# Patient Record
Sex: Female | Born: 1940 | Race: White | Hispanic: No | Marital: Married | Smoking: Never smoker
Health system: Southern US, Community
[De-identification: ages and names within clinical notes are randomized; demographics above are authoritative.]

## PROBLEM LIST (undated history)

## (undated) DIAGNOSIS — J45909 Unspecified asthma, uncomplicated: Secondary | ICD-10-CM

## (undated) DIAGNOSIS — C4491 Basal cell carcinoma of skin, unspecified: Secondary | ICD-10-CM

## (undated) DIAGNOSIS — R768 Other specified abnormal immunological findings in serum: Secondary | ICD-10-CM

## (undated) HISTORY — DX: Unspecified asthma, uncomplicated: J45.909

## (undated) HISTORY — DX: Basal cell carcinoma of skin, unspecified: C44.91

## (undated) HISTORY — DX: Other specified abnormal immunological findings in serum: R76.8

---

## 1997-02-21 DIAGNOSIS — C4491 Basal cell carcinoma of skin, unspecified: Secondary | ICD-10-CM

## 1997-02-21 HISTORY — DX: Basal cell carcinoma of skin, unspecified: C44.91

## 2000-02-22 HISTORY — PX: CERVICAL LAMINECTOMY: SHX94

## 2006-02-21 HISTORY — PX: LAMINECTOMY AND MICRODISCECTOMY LUMBAR SPINE: SHX1913

## 2013-12-18 ENCOUNTER — Ambulatory Visit (INDEPENDENT_AMBULATORY_CARE_PROVIDER_SITE_OTHER): Payer: 59 | Admitting: Sports Medicine

## 2013-12-18 VITALS — BP 121/56

## 2013-12-18 DIAGNOSIS — M75121 Complete rotator cuff tear or rupture of right shoulder, not specified as traumatic: Secondary | ICD-10-CM

## 2013-12-18 DIAGNOSIS — M75101 Unspecified rotator cuff tear or rupture of right shoulder, not specified as traumatic: Secondary | ICD-10-CM | POA: Insufficient documentation

## 2013-12-18 DIAGNOSIS — M12811 Other specific arthropathies, not elsewhere classified, right shoulder: Secondary | ICD-10-CM | POA: Insufficient documentation

## 2013-12-18 DIAGNOSIS — M25511 Pain in right shoulder: Secondary | ICD-10-CM

## 2013-12-18 MED ORDER — NITROGLYCERIN 0.2 MG/HR TD PT24: 0.2000 mg | MEDICATED_PATCH | Freq: Every day | TRANSDERMAL | Status: DC

## 2013-12-18 MED ORDER — NITROGLYCERIN 0.2 MG/HR TD PT24: MEDICATED_PATCH | TRANSDERMAL | Status: DC

## 2013-12-18 NOTE — Patient Instructions (Signed)
Your Tai chi has been helpful You do have a biceps tendon that we can see well on [place] that did not show on MRI 3 rotator cuff tendons are normal today  The tendon with the largest tear is healing The retraction is only 0.8 cms now There is only 30% of the tendon fibers involved  Suggest using NTG to stimulate biologic healing  .Nitroglycerin Protocol   Apply 1/4 nitroglycerin patch to affected area daily.  Change position of patch within the affected area every 24 hours.  You may experience a headache during the first 1-2 weeks of using the patch, these should subside.  If you experience headaches after beginning nitroglycerin patch treatment, you may take your preferred over the counter pain reliever.  Another side effect of the nitroglycerin patch is skin irritation or rash related to patch adhesive.  Please notify our office if you develop more severe headaches or rash, and stop the patch.  Tendon healing with nitroglycerin patch may require 12   Do not use if you have migraines or rosacea.   In addition to [place] I want you to do some home exercises on an easy basis  Recheck this with repeat scan in 6 weeks

## 2013-12-18 NOTE — Progress Notes (Signed)
Patient ID: Glenda Reese, female   DOB: September 22, 1940, 73 y.o.   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 She developed a RC tear primarily involving SST but with some tendinopathy in others as documented by July MRI at [facility] Dr Eden Lathe and Marlinda Mike-- who both recommended surgery because of 1 to 2 cm retraction At that time having severe pain  With Tai chi pain has resolved Motion is full She feels no real limitation in ADL  Comes for opinion about whether she can handle this conservatively  Healthy in other regards except hx of two lumbar surgeries which the [place] helped her recover from and become functional  Only medical issue of note is asthma and she uses Dulera for this  Exam  Pleasant, thin older F BP 121/56  Shoulder: Inspection reveals no abnormalities, atrophy or asymmetry. Palpation is normal with no tenderness over AC joint or bicipital groove. ROM is full in all planes. Rotator cuff strength normal throughout. No signs of impingement with negative Neer and Hawkin's tests, empty can. Speeds and Yergason's tests normal. No labral pathology noted with negative Obrien's, negative clunk and good stability. Normal scapular function observed. No painful arc and no drop arm sign. No apprehension sign  I don't see popeye deformity on RT even though MRI showed absent BT  Only pain today was with harder resistance of Hawkins and Yergason's and this was mild pain   MSK [place] right shoulder Biceps tendon is visualized and is intact note this was not seen on the MRI There is some mild irregularity and arthritis of the humeral head as well as some mild a.c. joint arthritis with a slight effusion Supraspinatous tendon shows that two thirds of the tendon is intact but one third of the tendon shows a partially retracted tear that is full thickness in the midportion of the tendon The retraction is less than seen on the MRI and  now measures 0.84 cm In the interval view this involves about 30% of the tendon  Subscapularis, infraspinatus and teres minor tendons are all normal

## 2013-12-18 NOTE — Assessment & Plan Note (Signed)
Pain is at an acceptable level so we did not start any medications today  She will continue motion and some home exercises

## 2013-12-18 NOTE — Assessment & Plan Note (Signed)
Patient has had significant improvement since her original injury which occurred late spring She has full function of the right shoulder and no night pain  Ultrasound does show retraction noted a small and the supraspinatous tendon fibers torn amount to about 30% of the total tendon width  Trial with home exercise program with very light weight Continue tai chi  Nitroglycerin protocol  Recheck in 6 weeks as I do not think she is a surgical candidate considering the improvement she has made

## 2013-12-19 ENCOUNTER — Encounter: Payer: Self-pay | Admitting: Sports Medicine

## 2014-01-29 ENCOUNTER — Ambulatory Visit: Payer: 59 | Admitting: Sports Medicine

## 2014-03-06 ENCOUNTER — Ambulatory Visit (INDEPENDENT_AMBULATORY_CARE_PROVIDER_SITE_OTHER): Payer: 59 | Admitting: Sports Medicine

## 2014-03-06 ENCOUNTER — Encounter: Payer: Self-pay | Admitting: Sports Medicine

## 2014-03-06 ENCOUNTER — Encounter (INDEPENDENT_AMBULATORY_CARE_PROVIDER_SITE_OTHER): Payer: Self-pay

## 2014-03-06 VITALS — BP 136/75 | HR 72 | Ht 64.0 in | Wt 117.0 lb

## 2014-03-06 DIAGNOSIS — M75121 Complete rotator cuff tear or rupture of right shoulder, not specified as traumatic: Secondary | ICD-10-CM

## 2014-03-06 DIAGNOSIS — M25511 Pain in right shoulder: Secondary | ICD-10-CM

## 2014-03-06 NOTE — Progress Notes (Signed)
  Glenda Reese - 74 y.o. female MRN [id number]  Date of birth: 30-May-1940  Glenda Reese is here to follow up: CC: Right Sided Shoulder Pain Patient shoulder pain has significantly improved since her last appointment. It is almost completely disappeared following her appointment in October after using nitroglycerin therapeutic exercises. She did have some return of her pain last month after cutting back on her exercises and increasing her activity around the holidays. She does have 10 grandchildren and was busy but denies having to lift her grandchildren or any significant injury that occurred over this time. She has since resumed her activity and in therapeutic exercises and this is been improving. Development Glenda Reese of her symptoms are mainly pain over the lateral aspect of her shoulder. It seems to be on the lateral and superior aspect which is improved compared to the anterior aspect. Therapeutic exercises do help decrease her pain when flared up. She is also being seen by her Integrative Medicine specialist Dr. Judeen Reese and is interested in neural therapy in addition to the home exercises she is doing.   ROS:  Per HPI.   HISTORY: Past Medical, Surgical, Social, and Family History Reviewed & Updated per EMR.  Pertinent Historical Findings include:  reports that she has never smoked. She does not have any smokeless tobacco history on file. Active and tai chi.   OBJECTIVE:  VS:   HT:_0  (162.6 cm)   WT:117 lb (53.071 kg)  BMI:20.1          BP:136/75 mmHg  HR:72bpm  TEMP: ( )  RESP:   PHYSICAL EXAM: GENERAL:  adult Caucasian within female. In no discomfort; no respiratory distress   PSYCH: alert and appropriate, good insight   NEURO: sensation is intact to light touch inbilateral upper extremities   VASCULAR:  radial  pulses 2/4.  No significant edema.    Right Shoulder Exam: Appearance: Normal alignment, Normal Contours  Skin: No overlying erythema/ecchymosis.  Palpation: Minimal pain  and moderate crepitation with axial loading and circumduction; minimal crepitation on left TTP over: anterior and superior shouler  Strength, ROM & OtherTests: Internal Rotation: Normal External Rotation: Normal Empty can: slight pain, 5+/5 strength Hawkins: Normal Neers: painful Speeds:Normal O'Brien's: painful on right      Limited MSK Ultrasound of Right Shoulder: Findings: Biceps Tendon: Normal, small caliber Pec Major Insertion: Normal Subscapularis Tendon: Normal Supraspinatus Tendon:Abnormal- hyopechoic change with retraction of 0.8cm involving ~1.2cm of tendon, decreased fluid Infraspinatus/Teres Minor Tendon:Normal AC Joint:Abnormal- moderate degerative changes  Impression: The above findings are consistent with full thickness SST tear with improvement in pain and retraction significant less than prior MRI     ASSESSMENT: 1. Shoulder pain, right   2. Full thickness rotator cuff tear, right    PLAN: See problem based charting & AVS for additional documentation.  Continue HEP as below  Resume Nitroglycerin - will likely need to have closer to 6 months of treatment given extent of initial injury  Discussed Neural therapy and if pt wants to proceed we are amenable to her trying it  Rx Today: Continue Nitro  HEP: [place] to shoulder level and 90.  Light Theraband  > No Follow-up on file.

## 2014-03-13 ENCOUNTER — Other Ambulatory Visit: Payer: Self-pay | Admitting: *Deleted

## 2014-03-13 MED ORDER — NITROGLYCERIN 0.2 MG/HR TD PT24: MEDICATED_PATCH | TRANSDERMAL | Status: DC

## 2014-06-12 ENCOUNTER — Encounter: Payer: Self-pay | Admitting: Sports Medicine

## 2014-06-12 ENCOUNTER — Ambulatory Visit (INDEPENDENT_AMBULATORY_CARE_PROVIDER_SITE_OTHER): Payer: Medicare Other | Admitting: Sports Medicine

## 2014-06-12 VITALS — BP 127/61 | Ht 64.5 in | Wt 115.0 lb

## 2014-06-12 DIAGNOSIS — M25511 Pain in right shoulder: Secondary | ICD-10-CM

## 2014-06-12 DIAGNOSIS — M21371 Foot drop, right foot: Secondary | ICD-10-CM | POA: Diagnosis not present

## 2014-06-12 DIAGNOSIS — M75121 Complete rotator cuff tear or rupture of right shoulder, not specified as traumatic: Secondary | ICD-10-CM | POA: Diagnosis not present

## 2014-06-12 MED ORDER — GABAPENTIN 100 MG PO CAPS: ORAL_CAPSULE | ORAL | Status: DC

## 2014-06-12 NOTE — Assessment & Plan Note (Signed)
Chronic condition  - prior rigid three-quarter length orthotic modified today with reverse toe lift with three-quarter heel wedge. 1. Toe lift added 2. Discussed bracing options if she is interested in the future but we will defer this time. 3. Gabapentin titration discussed

## 2014-06-12 NOTE — Progress Notes (Signed)
Glenda Reese - 74 y.o. female MRN [id number]  Date of birth: 1940-06-24  SUBJECTIVE: CC: 1. Shoulder pain, follow up 2. Right foot drop, initial evaluation      HPI:   reports overall her right shoulder is doing significantly better. She's been performing her therapeutic exercises and using nitroglycerin patch. Excellent symptoms to some weakness with overhead reach and with terminal external rotation  She will have occasional pain that radiates down either her left or right arm as described as a "zing". Prior cervical surgery for HNP. Denies any significant weakness with this.  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  Occasionally taking gabapentin      ROS:   denies any fevers, chills, recent weight gain or weight loss.  No nighttime awakenings due to neck or back pain.    HISTORY:  Past Medical, Surgical, Social, and Family History reviewed & updated per EMR.  Pertinent Historical Findings include: Social History   Occupational History  . Not on file.   Social History Main Topics  . Smoking status: Never Smoker   . Smokeless tobacco: Not on file  . Alcohol Use: Not on file  . Drug Use: Not on file  . 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 In no discomfort; no respiratory distress   PSYCH: alert and appropriate, good insight   NEURO: Upper extremity strength is 5+/5 in all myotomes; sensation is intact to light touch in all dermatomes. He is able to toe walk without difficulty but is unable to perform right-sided heel walking. EHL strength 3/5. Marland Kitchen  VASCULAR:  radial  pulses 2/4.  No significant edema.   GAIT: She does have a foot drop on the right with moderate dorsiflexion Right Shoulder Exam: Appearance: Normal alignment, Normal Contours  Skin: No overlying erythema/ecchymosis.  Palpation: Minimal pain and moderate crepitation with axial loading and circumduction; minimal crepitation on left TTP over: anterior and superior shouler  Strength, ROM & OtherTests: Internal Rotation: Normal External Rotation: Normal Empty can: slight pain, 5/5 strength Hawkins: Normal Neers: Normal Speeds:Normal O'Brien's: Normal    DATA OBTAINED:   Limited MSK Ultrasound of Right Shoulder: Findings: Biceps Tendon: Intact, small amount of fluid Pec Major Insertion: normal Subscapularis Tendon: Bursal fluid layer with tendon that is intact with small amount of calcification at the insertion Supraspinatus Tendon: Full thickness tear retracted approximately half a centimeter Infraspinatus/Teres Minor Tendon: Normal AC Joint: Small amount of hypoechoic mushroom sign with sclerotic changes  Impression: The above findings are consistent with full-thickness supraspinatus tear retracted approximately 0.5 cm. AC arthropathy.        ASSESSMENT & PLAN:  See problem based charting & AVS for additional documentation Problem List Items Addressed This Visit    Shoulder pain, right - Primary    Chronic condition  - full-thickness supraspinatus tear, started nitroglycerin therapy in October 2015. Ultrasound evidence of improvement 2 cm retraction on exam today. Symptoms markedly improved some suggestion of cervical radiculitis on  exam and by history. 1. Continue nitroglycerin patch, tolerating well 2. Home exercise program reviewed today 3. Recommend increasing frequency of gabapentin dosing when symptoms are worse & taking for 3-5 days at minimum daily at bedtime when having flareups. 4. Follow-up in 2 months for repeat ultrasound         Full thickness rotator cuff tear   Foot drop, right    Chronic condition  - prior rigid three-quarter length orthotic modified today with reverse toe lift with three-quarter heel wedge. 5. Toe lift added 6. Discussed bracing options if she is interested in the future but we will defer this time. 7. Gabapentin titration discussed           FOLLOW UP:  Return in about 2 months (around 08/12/2014).

## 2014-06-12 NOTE — Patient Instructions (Signed)
Nitroglycerin Protocol   Apply 1/4 nitroglycerin patch to affected area daily.  Change position of patch within the affected area every 24 hours.  You may experience a headache during the first 1-2 weeks of using the patch, these should subside.  If you experience headaches after beginning nitroglycerin patch treatment, you may take your preferred over the counter pain reliever.  Another side effect of the nitroglycerin patch is skin irritation or rash related to patch adhesive.  Please notify our office if you develop more severe headaches or rash, and stop the patch.  Tendon healing with nitroglycerin patch may require 12 to 24 weeks depending on the extent of injury.  Men should not use if taking Viagra, Cialis, or Levitra.   Do not use if you have migraines or rosacea.   Neck Exercises: Stand against the wall Towel Stretching    For the below:  Hold 3-4seconds. Release the tension in a controlled manner as you return to the starting position.  Repeat 15 times. Complete this exercise 3 times per day.   DO NOT GO OVERHEAD   STRENGTH - Internal Rotators  Secure a rubber exercise band/tubing to a fixed object so that it is at the same height as your right / left elbow when you are standing or sitting on a firm surface.  Stand or sit so that the secured exercise band/tubing is at your right / left side.  Bend your elbow 90 degrees. Place a folded towel or small pillow under your right / left arm so that your elbow is a few inches away from your side.  Keeping the tension on the exercise band/tubing, pull it across your body toward your abdomen. Be sure to keep your body steady so that the movement is only coming from your shoulder rotating.  STRENGTH - External Rotators  Secure a rubber exercise band/tubing to a fixed object so that it is at the same height as your right / left elbow when you are standing or sitting on a firm surface.  Stand or sit so that the secured  exercise band/tubing is at your side that is not injured.  Bend your elbow 90 degrees. Place a folded towel or small pillow under your right / left arm so that your elbow is a few inches away from your side.  Keeping the tension on the exercise band/tubing, pull it away from your body, as if pivoting on your elbow. Be sure to keep your body steady so that the movement is only coming from your shoulder rotating.  STRENGTH - Supraspinatus  Stand or sit with good posture. Grasp a __________ lb weight or an exercise band/tubing so that your hand is "thumbs-up," like when you shake hands.  Slowly lift your right / left hand from your thigh into the air, traveling about 30 degrees from straight out at your side. Lift your hand to shoulder height or as far as you can without increasing any shoulder pain. Initially, many people do not lift their hands above shoulder height.  Avoid shrugging your right / left shoulder as your arm rises by keeping your shoulder blade tucked down and toward your mid-back spine.  STRENGTH - Shoulder Abductors  Stand or sit with good posture. Place your right / left arm at your side.  With a thumbs-up grasp, hold a __________ weight or a secured rubber exercise band/tubing in your right / left hand. Slowly lift your arm from your side as far as you can without reproducing any of  your shoulder pain. Do not lift your hand above shoulder-height unless you have been instructed to do so by your physician, physical therapist or athletic trainer. If this motion causes pain or increased discomfort, discuss this with your physician, physical therapist, or athletic trainer.  Avoid shrugging your right / left shoulder as your arm rises by keeping your shoulder blade tucked down and toward your mid-back spine.

## 2014-06-12 NOTE — Procedures (Signed)
Limited MSK Ultrasound of Right Shoulder: Findings: Biceps Tendon: Intact, small amount of fluid Pec Major Insertion: normal Subscapularis Tendon: Bursal fluid layer with tendon that is intact with small amount of calcification at the insertion Supraspinatus Tendon: Full thickness tear retracted approximately half a centimeter Infraspinatus/Teres Minor Tendon: Normal AC Joint: Small amount of hypoechoic mushroom sign with sclerotic changes  Impression: The above findings are consistent with full-thickness supraspinatus tear retracted approximately 0.5 cm. AC arthropathy.

## 2014-06-12 NOTE — Assessment & Plan Note (Signed)
Chronic condition  - full-thickness supraspinatus tear, started nitroglycerin therapy in October 2015. Ultrasound evidence of improvement 2 cm retraction on exam today. Symptoms markedly improved some suggestion of cervical radiculitis on exam and by history. 1. Continue nitroglycerin patch, tolerating well 2. Home exercise program reviewed today 3. Recommend increasing frequency of gabapentin dosing when symptoms are worse & taking for 3-5 days at minimum daily at bedtime when having flareups. 4. Follow-up in 2 months for repeat ultrasound

## 2014-06-22 DIAGNOSIS — R768 Other specified abnormal immunological findings in serum: Secondary | ICD-10-CM

## 2014-06-22 HISTORY — DX: Other specified abnormal immunological findings in serum: R76.8

## 2014-08-14 ENCOUNTER — Encounter: Payer: Self-pay | Admitting: Sports Medicine

## 2014-08-14 ENCOUNTER — Ambulatory Visit (INDEPENDENT_AMBULATORY_CARE_PROVIDER_SITE_OTHER): Payer: Medicare Other | Admitting: Sports Medicine

## 2014-08-14 VITALS — BP 116/75 | HR 69 | Ht 64.0 in | Wt 116.0 lb

## 2014-08-14 DIAGNOSIS — M75121 Complete rotator cuff tear or rupture of right shoulder, not specified as traumatic: Secondary | ICD-10-CM

## 2014-08-14 MED ORDER — NITROGLYCERIN 0.2 MG/HR TD PT24: MEDICATED_PATCH | TRANSDERMAL | Status: DC

## 2014-08-14 NOTE — Progress Notes (Signed)
Patient ID: Glenda Reese, female   DOB: 10/20/1940, 74 y.o.   MRN: [id number]   HPI  Patient presents today for follow up of her R rotator cuff tear  Since her last visit she notes continued improvement. She has been using the NTG patch regularly except for the last 2 weeks when she was on a cruise. She has been doing the exercises regularly as well.   She denies limiting pain from her shoulder. She does mention that she recently had a flare of generalized shoulder pain similar to teh pain she experienced last spring that spurred her to seek attention for her shoulder. It started while she was on a cruise where acupuncture was available which nearly resolved her pain. She has a Hx of HNP s/p cervical laminectomy in 2002.   She denies additional injury or side effects from NTG. Her orignial tear was diagnosed on MRI from [facility] in July 2015.   PMH: Smoking status noted ROS: Per HPI  Objective: BP 116/75 mmHg  Pulse 69  Ht 5\' 4"  (1.626 m)  Wt 116 lb (52.617 kg)  BMI 19.90 kg/m2 Gen: NAD, alert, cooperative with exam HEENT: NCAT Ext: No edema, warm Neuro: Alert and oriented, Strength 5/5 on L shoulder, 4/5 on R shoudler with provacative testing with empty can and Hawkins MSK:  Shoulder:  L shoulder without tenderness to palpation No gross deformity or lesions on skin Pain and 4/5 comapred to 5/5 on R strength with empty can and hawkins test Full ROM on BL shoulders Neck: ROM limited to 70degrees with Leftward rotation, otherwise full.   MSK [place]  supraspinatous tendon shows almost complete resolution of a prior tear. There is an area of thinning and probable some old retraction. On interval view the hypoechoic area is confined about 5% of the tendon width versus 30% at the beginning Other tendons and structures looked intact. There is some osteoarthritic change of the humeral head.  Assessment and plan:  Full thickness rotator cuff tear Improving clinically and nearly resolved on  MSK [place] today. Discussed escalating HEP slightly by alternating band and strength training. Add neck exercises, discussed PRN gabapentin dosing for neck pain.  Continue NTG for 3 more months and f/u in 3 months      Meds ordered this encounter  Medications  . nitroGLYCERIN (NITRODUR - DOSED IN MG/24 HR) 0.2 mg/hr patch    Sig: Place 1/4 patch on affected area daily    Dispense:  8 patch    Refill:  1

## 2014-08-14 NOTE — Patient Instructions (Signed)
Great to meet you!  Alternate weight and band training on your arm exercises. A soup can is a good starting place.   Try the towel exercises for your neck.   Use gabapentin for 1-2 weeks during flares, these do seem to be coming from your neck so it will likely come on in flares that last 5-14 days and then resolve.   Come back in 3 months

## 2014-08-14 NOTE — Assessment & Plan Note (Addendum)
Improving clinically and nearly resolved on MSK [place] today. Discussed escalating HEP slightly by alternating band and strength training. Add neck exercises, discussed PRN gabapentin dosing for neck pain.  Continue NTG for 3 more months and f/u in 3 months

## 2014-11-17 ENCOUNTER — Other Ambulatory Visit: Payer: Self-pay | Admitting: *Deleted

## 2014-11-17 MED ORDER — GABAPENTIN 100 MG PO CAPS: ORAL_CAPSULE | ORAL | Status: DC

## 2014-11-24 ENCOUNTER — Ambulatory Visit (INDEPENDENT_AMBULATORY_CARE_PROVIDER_SITE_OTHER): Payer: Medicare Other | Admitting: Infectious Diseases

## 2014-11-24 ENCOUNTER — Encounter: Payer: Self-pay | Admitting: Infectious Diseases

## 2014-11-24 VITALS — BP 132/75 | HR 77 | Temp 98.2°F | Ht 64.0 in | Wt 115.0 lb

## 2014-11-24 DIAGNOSIS — A310 Pulmonary mycobacterial infection: Secondary | ICD-10-CM | POA: Insufficient documentation

## 2014-11-24 NOTE — Assessment & Plan Note (Signed)
I spoke with the pt and her son at length about the diagnosis- She currently has only 1/3 diagnostic criteria- she has 1 positive sputum (she needs a second within a year) and she needs a positive radiograph (CT or CXR).  We spoke about the therapy for this - eth/clarithro/rif for 6 months at least. And that there is no guarantee of a cure with this.  I gave her a sputum Cx container for her to attempt a Cx when she completes her current biaxin (to send 1 month after she completes) and to consider radiograph.  She has some concerns about her IgG levels (1/5 were slightly low, her total was normal). I would have not considered that she needs Ig supplementation based on this. She is considering this.  She will consider her options and let us know how she would like to proccede.

## 2014-11-24 NOTE — Progress Notes (Signed)
   Subjective:    Patient ID: Glenda Reese, female    DOB: 05/06/1940, 74 y.o.   MRN: [id number]  HPI  74 yo F with hx asthma ,ANA+ 1:320 (she was seen by Rheum- no dx made). 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 She has had recurrent cough for > 1 year. Each episode cleared with anbx/steroids.  Since July of this year has been on cycles of anbx.   Has been producing sputum since August, was put on clarithro for ~ 1 week.  Is active- hikes, takes dance class.   Review of Systems  Constitutional: Negative for fever, chills, appetite change and unexpected weight change.  HENT: Positive for congestion.   Respiratory: Positive for cough and shortness of breath.   Cardiovascular: Negative for chest pain and leg swelling.  Gastrointestinal: Negative for diarrhea and constipation.  Genitourinary: Negative for difficulty urinating.       Objective:   Physical Exam  Constitutional: She appears well-developed and well-nourished.  HENT:  Mouth/Throat: No oropharyngeal exudate.  Eyes: EOM are normal. Pupils are equal, round, and reactive to light.  Neck: Neck supple.  Cardiovascular: Normal rate, regular rhythm and normal heart sounds.   Pulmonary/Chest: Effort normal and breath sounds normal.  Abdominal: Soft. Bowel sounds are normal. There is no tenderness. There is no rebound.  Musculoskeletal: She exhibits no edema.  Lymphadenopathy:    She has no cervical adenopathy.    She has no axillary adenopathy.       Assessment & Plan:

## 2014-12-08 ENCOUNTER — Ambulatory Visit: Payer: Medicare Other | Admitting: Infectious Diseases

## 2015-02-03 ENCOUNTER — Other Ambulatory Visit: Payer: Self-pay | Admitting: *Deleted

## 2015-02-03 MED ORDER — GABAPENTIN 100 MG PO CAPS: ORAL_CAPSULE | ORAL | Status: DC

## 2015-03-04 ENCOUNTER — Ambulatory Visit: Payer: Medicare Other | Admitting: Sports Medicine

## 2015-03-23 ENCOUNTER — Ambulatory Visit: Payer: Medicare Other | Admitting: Sports Medicine

## 2015-03-31 ENCOUNTER — Ambulatory Visit (INDEPENDENT_AMBULATORY_CARE_PROVIDER_SITE_OTHER): Payer: Medicare HMO | Admitting: Sports Medicine

## 2015-03-31 ENCOUNTER — Encounter: Payer: Self-pay | Admitting: Sports Medicine

## 2015-03-31 VITALS — BP 143/67 | HR 73 | Ht 64.0 in | Wt 116.0 lb

## 2015-03-31 DIAGNOSIS — M75121 Complete rotator cuff tear or rupture of right shoulder, not specified as traumatic: Secondary | ICD-10-CM | POA: Diagnosis not present

## 2015-03-31 NOTE — Progress Notes (Signed)
  Glenda Reese - 75 y.o. female MRN [id number]  Date of birth: 1940/03/29  SUBJECTIVE:  Including CC & ROS.  No chief complaint on file. CC: F/U rotator cuff injury  HPI: Patient presents for follow up of a R rotator cuff injury - last visit with Dr. Oneida Alar on 08/14/14. Reports that she is feeling much improved. Completed 6 months of nitro protocol in December. Discontinued her gabapentin in January. Continues to do tai chi, stretches, and exercises to stay active. Notices mild shoulder discomfort if she has not been active for a few days, but the pain resolves when she resumes her activities. She has not been taking OTC pain medication. Able to perform all of her daily activities without issues.   HISTORY: Past Medical, Surgical, Social, and Family History Reviewed & Updated per EMR.   Pertinent Historical Findings include: Patient is retired, but stays active by doing tai chi, walking, hiking, and stretching. Is a non-smoker.  DATA REVIEWED: R shoulder [place]: Ultrasound images were compared to prior scans. There are still some hypoechoic changes in the distal supraspinatus which appear unchanged when compared to prior studies.  PHYSICAL EXAM:  VS: BP:(!) 143/67 mmHg  HR:73bpm  TEMP: ( )  RESP:   HT:_0  (162.6 cm)   WT:116 lb (52.617 kg)  BMI:20 PHYSICAL EXAM: R shoulder: No erythema or effusion noted. No TTP over [place] joint, clavicle, AC joint, scapular spines, or humeral head. Full ROM with internal and external rotation, adduction and abduction. 4+/5 strength with resisted supraspinatus and external rotation testing. 5/5 with internal rotation Mild pain with empty can test. Neurovascularly intact.  ASSESSMENT & PLAN: See problem based charting & AVS for pt instructions. 1.) Right rotator cuff tear Patient with significant clinical improvement since initial visit. No longer requiring nitro patch, gabapentin, or OTC pain medication. R shoulder [place] with signs of prior injury, but stable  from last scan with Dr. Oneida Alar in June 2016.  - Encouraged patient to re-start her strengthening exercises with elastic band as some residual weakness is present - Will continue daily stretching and tai chi  - Follow up PRN

## 2015-09-30 ENCOUNTER — Ambulatory Visit (INDEPENDENT_AMBULATORY_CARE_PROVIDER_SITE_OTHER): Payer: Medicare HMO | Admitting: Sports Medicine

## 2015-09-30 ENCOUNTER — Encounter: Payer: Self-pay | Admitting: Sports Medicine

## 2015-09-30 DIAGNOSIS — M25561 Pain in right knee: Secondary | ICD-10-CM

## 2015-09-30 NOTE — Assessment & Plan Note (Signed)
Most likely she has a cartilage contusion and some aggravation of the insertion of her quad tendon where the calcium changes were observed. No effusion to suggest structural damage.  - the area of abrasion was cleaned and wrapped with an ACE wrap.  - she will continue the wrap and rolling walker in the following two weeks. She can try walking normally if there is no pain.  - she will follow up in 3-4 weeks if she has had no improvement of her symptoms.

## 2015-09-30 NOTE — Progress Notes (Signed)
  Glenda Reese - 75 y.o. female MRN [id number]  Date of birth: 03-26-1940  SUBJECTIVE:  Including CC & ROS.  Chief Complaint  Patient presents with  . Knee Pain   Glenda Reese is a 75 yo F that is presenting with right knee pain after a fall. 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 She had immediate pain and was able to bear weight. She was taken the [facility] ED and x-rays were normal. She has noticed some improvement but is still having significant pain with bearing weight. She was placed in a knee immobilizer at the ED but has not been wearing it. She has been using a rolling walker since she has had significant pain. The pain is all over her knee with also some pain her buttock. She describes the pain as achy. She denies any numbness or tingling. Has been using tylenol and ibuprofen with some improvement.   ROS: No unexpected weight loss, fever, chills, instability,  numbness/tingling, redness, otherwise see HPI   HISTORY: Past Medical, Surgical, Social, and Family History Reviewed & Updated per EMR.   Pertinent Historical Findings include: PMSHx -  2 back surgeries  PSHx -  No tobacco or alcohol use  FHx -  Mother with alzheimer's. Father with colon cancer.  Medications - gabapentin   DATA REVIEWED: 09/28/15: x-ray of right knee performed at [place] described as normal with no fracture   PHYSICAL EXAM:  VS: BP:130/70  HR: bpm  TEMP: ( )  RESP:   HT:5\' 4"  (162.6 cm)   WT:115 lb (52.2 kg)  BMI:19.8 PHYSICAL EXAM: Gen: NAD, alert, cooperative with exam, using rolling walker HEENT: clear conjunctiva, EOMI CV:  Trace edema, capillary refill brisk,  Resp: non-labored, normal speech Skin: abrasion on right knee, normal turgor  Neuro: no gross deficits.  Psych:  alert and oriented Knee: Abrasion over her patella on right side with no erythema or streaking. Noticeable swelling in the distal part of her right leg.   Tenderness to palpation of the  patella and behind the knee.  Unable to achieve full flexion or extension secondary to pain  Ligaments with solid consistent endpoints including ACL,  LCL, MCL. Negative Mcmurray's. Patellar and quadriceps tendons unremarkable. Hamstring and quadriceps strength is normal.  Negative straight leg raise bilaterally  Neurovascularly intact   Limited US: right knee: no effusion noted in SPP. Quad tendon intact but calcification of insertion upon patella is observed but also a notch of bone which could represent a small avulsion fracture or patellar fracture. Normal appearing patellar tendon, medial meniscus and lateral meniscus. Sunrise view with no significant changes.    ASSESSMENT & PLAN:   Right knee pain Most likely she has a cartilage contusion and some aggravation of the insertion of her quad tendon where the calcium changes were observed. No effusion to suggest structural damage.  - the area of abrasion was cleaned and wrapped with an ACE wrap.  - she will continue the wrap and rolling walker in the following two weeks. She can try walking normally if there is no pain.  - she will follow up in 3-4 weeks if she has had no improvement of her symptoms.

## 2015-10-13 ENCOUNTER — Ambulatory Visit (INDEPENDENT_AMBULATORY_CARE_PROVIDER_SITE_OTHER): Payer: Medicare HMO | Admitting: Sports Medicine

## 2015-10-13 ENCOUNTER — Ambulatory Visit: Payer: Medicare HMO | Admitting: Sports Medicine

## 2015-10-13 ENCOUNTER — Encounter: Payer: Self-pay | Admitting: Sports Medicine

## 2015-10-13 DIAGNOSIS — M25561 Pain in right knee: Secondary | ICD-10-CM | POA: Diagnosis not present

## 2015-10-13 DIAGNOSIS — M25551 Pain in right hip: Secondary | ICD-10-CM

## 2015-10-13 NOTE — Assessment & Plan Note (Addendum)
Patient is here for persistent knee pain and swelling after suffering a fall. At her initial visit patients knee had been ultrasounded which yielded a bony abnormality of the patella suggestive of a "chip". Physical exam today yielded no red flag symptoms for new pathology. Patient is likely experiencing persistent pain from this small fracture as well as subsequent discomfort from contusions experienced during this fall. The hip pain patient is now experiencing is likely compensatory hip pain from an abnormal gait to relieve pressure from the knee. - Hip strengthening exercises provided today: Hip circles, forward flexion, hamstring curls, abduction, adduction.  - Knee brace provided today to help reduce any additional movement of the patella. 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 - Follow-up in 2 weeks  While patient is clearly better she needs to rest this more if it is to heal.  Has a small chip fracture of patella from fall and a contusion of hip.  Still trying to do Tai chi daily!

## 2015-10-13 NOTE — Assessment & Plan Note (Addendum)
Likely compensatory from favoring/offloading the right knee. - Hip strengthening exercises; See plan above

## 2015-10-13 NOTE — Progress Notes (Signed)
   HPI  CC: Right hip and knee pain; traumatic Patient is here for follow-up on her right knee pain. She states that her knee pain persists with only mild improvement in pain, but significant improvement in her ability to ambulate. Patient is now also experiencing right hip pain, as well as some occasional right foot numbness with prolonged standing. Majority of patients pain is located at and around her right knee. She denies any reduced range of motion or weakness in this knee. 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  Medications/Interventions Tried: Compression sleeve (made pain worse)  See HPI for ROS.  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  Objective: BP 134/74   Pulse 80   Ht 5\' 4"  (1.626 m)   Wt 115 lb (52.2 kg)   BMI 19.74 kg/m  Gen: NAD, alert, cooperative. CV: Well-perfused. Resp: Non-labored. Neuro: Sensation intact throughout. Knee, right: Notable swelling and warmth present. Healing ecchymoses present around the patella, posterior lateral thigh, and posterior calf. No obvious bony abnormalities. Medial joint line tenderness present. Significant discomfort/pain with palpation of the patella. Range of motion intact with flexion and extension with some pain. All 4 ligaments with solid end points. Patellar/quadriceps tendon intact. Normal strength throughout. Hip, right: No obvious erythema or abnormalities present. Range of motion intact in all directions. Strength intact throughout. FABER elicited pain in the knee. Pelvic alignment unremarkable to inspection and palpation. No trendelenburg / unsteadiness present. No greater trochanteric tenderness. Moderate tenderness over the piriformis/glute. No SI joint tenderness. Gait is slowed and noticeable offloading of the affected knee is present.  Assessment and plan:  Right knee  pain Patient is here for persistent knee pain and swelling after suffering a fall. At her initial visit patients knee had been ultrasounded which yielded a bony abnormality of the patella suggestive of a "chip". Physical exam today yielded no red flag symptoms for new pathology. Patient is likely experiencing persistent pain from this small fracture as well as subsequent discomfort from contusions experienced during this fall. The hip pain patient is now experiencing is likely compensatory hip pain from an abnormal gait to relieve pressure from the knee. - Hip strengthening exercises provided today: Hip circles, forward flexion, hamstring curls, abduction, adduction.  - Knee brace provided today to help reduce any additional movement of the patella. 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 - Follow-up in 2 weeks  Right hip pain Likely compensatory from favoring/offloading the right knee. - Hip strengthening exercises; See plan above   Elberta Leatherwood, MD,MS,  PGY3 10/13/2015 1:00 PM  I evaluated with Dr Alease Frame and edited note.  Ysidro Evert

## 2015-10-13 NOTE — Patient Instructions (Signed)
It was a pleasure seeing you today in our clinic. Today we discussed your knee and hip pain. Here is the treatment plan we have discussed and agreed upon together:   - Continue to wear the knee brace every day while you are up and active for the next 2 weeks. 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  Exercises: Perform these twice a day every day - Hip circles: 15 revolutions clockwise, 15 revolutions counterclockwise. - Hip flexors (forward flexing at the hip): 15 repetitions - Hamstring curls (flexing/bending at the knee): 15 repetitions - Hip abduction (extending your leg away from your body): 15 repetitions - Hip adduction (pulling your leg across your body): 15 repetitions

## 2015-10-27 ENCOUNTER — Encounter: Payer: Self-pay | Admitting: Sports Medicine

## 2015-10-27 ENCOUNTER — Ambulatory Visit (INDEPENDENT_AMBULATORY_CARE_PROVIDER_SITE_OTHER): Payer: Medicare HMO | Admitting: Sports Medicine

## 2015-10-27 VITALS — BP 87/58 | HR 76 | Ht 64.0 in | Wt 115.0 lb

## 2015-10-27 DIAGNOSIS — M5440 Lumbago with sciatica, unspecified side: Secondary | ICD-10-CM | POA: Diagnosis not present

## 2015-10-27 DIAGNOSIS — S82001G Unspecified fracture of right patella, subsequent encounter for closed fracture with delayed healing: Secondary | ICD-10-CM

## 2015-10-27 DIAGNOSIS — M25561 Pain in right knee: Secondary | ICD-10-CM | POA: Diagnosis not present

## 2015-10-27 DIAGNOSIS — M545 Low back pain, unspecified: Secondary | ICD-10-CM | POA: Insufficient documentation

## 2015-10-27 NOTE — Progress Notes (Signed)
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  Social history Nonsmoker Teaches tai chi  Review of systems No significant swelling in the right knee since last visit Knee brace does provide comfort No increase in weakness Increased pain if she walks up or down stairs affects the right knee  Physical exam Older but physically fit female BP (!) 87/58   Pulse 76   Ht _0  (1.626 m)   Wt 115 lb (52.2 kg)   BMI 19.74 kg/m   Right knee currently shows good flexion and extension No swelling directly over the patella She has minimal pain when I palpate this area  Some lumbar low back pain that is midline Knee to chest stretch relieves pain Knee to opp. shoulder stretch relieves pain Lumbar flexion relieves pain Lumbar extension increases pain

## 2015-10-27 NOTE — Patient Instructions (Signed)
Let's try a PT assessment  I would suggest 3 short walks per day - not to exceed 10 mins each  Add chair stretches for the low back  Add wall and floor pelvic tilts  Use brace for walking or standing only  Your knee is actually ahead of typical healing  However, I think the leg issues come from aggravating the old low back issues - prior surgeries Be sure and work good stretches until we can get you back to [place]  See me in one month  Try using tramadol just in morning If this works use the gabapentin and tylenol at night

## 2015-10-27 NOTE — Assessment & Plan Note (Signed)
I wanted her to restart stretches for the lumbar spine Pelvic tilts Caution with excess walking and with steps  Physical therapy evaluation for other suggestions

## 2015-10-27 NOTE — Assessment & Plan Note (Signed)
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

## 2015-11-10 ENCOUNTER — Encounter: Payer: Self-pay | Admitting: Sports Medicine

## 2015-11-13 ENCOUNTER — Other Ambulatory Visit: Payer: Self-pay | Admitting: *Deleted

## 2015-11-13 DIAGNOSIS — M25562 Pain in left knee: Secondary | ICD-10-CM

## 2015-11-17 ENCOUNTER — Encounter: Payer: Self-pay | Admitting: Sports Medicine

## 2015-11-18 ENCOUNTER — Ambulatory Visit (INDEPENDENT_AMBULATORY_CARE_PROVIDER_SITE_OTHER): Payer: Medicare HMO | Admitting: Sports Medicine

## 2015-11-18 ENCOUNTER — Encounter: Payer: Self-pay | Admitting: Sports Medicine

## 2015-11-18 DIAGNOSIS — M25561 Pain in right knee: Secondary | ICD-10-CM

## 2015-11-18 DIAGNOSIS — M25552 Pain in left hip: Secondary | ICD-10-CM

## 2015-11-18 NOTE — Assessment & Plan Note (Signed)
Most likely she had an exacerbation of sciatica from her ongoing physical therapy of her right knee. The prednisone has seemed to resolve her symptoms as well as not going to physical therapy this week. - Advised that she does not need to pursue physical therapy for this. She will experience as much improvement with tai chi as she would physical therapy. - Follow-up as needed.

## 2015-11-18 NOTE — Progress Notes (Signed)
  Glenda Reese - 75 y.o. female MRN [id number]  Date of birth: January 12, 1941  SUBJECTIVE:  Including CC & ROS.  Chief Complaint  Patient presents with  . Knee Pain  . Hip Pain     Glenda Reese is a 75 yo F that is following up for right knee pain. She is also presenting with new left-sided hip pain. She reports that the knee has been doing well. She denies any significant pain. She is not been using a cane or a knee brace. She has been undergoing physical therapy.  She reports that she is having pain in her left buttock. The pain was reading down the posterior aspect of her left leg into her foot. She was attributed in this new onset of pain to the physical therapist concentrating rehabilitation on her right knee. She has not been able to sleep when the pain is at its worse. She went to an urgent care and wants to save and was prescribed prednisone. She is complaining that course and required reports improvement of her pain. She also denies having any physical therapy this week. She has taken Tylenol as well. She denies any injury in the interim. She denies any giving out or buckling of her leg.  ROS: No unexpected weight loss, fever, chills, swelling, instability, muscle pain, numbness/tingling, redness, otherwise see HPI    HISTORY: Past Medical, Surgical, Social, and Family History Reviewed & Updated per EMR.   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  Psych:  alert and  oriented Hip: Normal hip flexion bilaterally. No pain to palpation of the greater trochanter bilaterally No pain to palpation of the SI joints bilaterally. Normal internal/external rotation bilaterally. 5 out of 5 strength in lower extension views bilaterally. Negative FABER and FADIR test bilaterally. Negative straight leg raise bilaterally. Right Knee:  Able to achieve full extension. Almost to full flexion. 5 out of 5 strength. No tenderness to palpation of the quad or patellar tendon. Gait:  Her left shoulder is held at a higher elevation than the right. Normal gait. Normal foot strike with no limp.   ASSESSMENT & PLAN:   Right knee pain Most likely her symptoms have resolved and have healed. - Advised that she does not have to undergo physical therapy as she can get most of her ongoing therapy with tai chi. - Follow-up as an as-needed basis  Left hip pain Most likely she had an exacerbation of sciatica from her ongoing physical therapy of her right knee. The prednisone has seemed to resolve her symptoms as well as not going to physical therapy this week. - Advised that she does not need to pursue physical therapy for this. She will experience as much improvement with tai chi as she would physical therapy. - Follow-up as needed.

## 2015-11-18 NOTE — Assessment & Plan Note (Signed)
Most likely her symptoms have resolved and have healed. - Advised that she does not have to undergo physical therapy as she can get most of her ongoing therapy with tai chi. - Follow-up as an as-needed basis

## 2015-11-25 ENCOUNTER — Ambulatory Visit: Payer: Medicare HMO | Admitting: Sports Medicine

## 2016-03-22 ENCOUNTER — Ambulatory Visit: Payer: Medicare HMO | Admitting: Sports Medicine

## 2016-03-29 ENCOUNTER — Ambulatory Visit: Payer: Self-pay

## 2016-03-29 ENCOUNTER — Encounter: Payer: Self-pay | Admitting: Sports Medicine

## 2016-03-29 ENCOUNTER — Ambulatory Visit (INDEPENDENT_AMBULATORY_CARE_PROVIDER_SITE_OTHER): Payer: Medicare HMO | Admitting: Sports Medicine

## 2016-03-29 VITALS — BP 157/75 | HR 79 | Ht 64.0 in | Wt 115.0 lb

## 2016-03-29 DIAGNOSIS — M25511 Pain in right shoulder: Secondary | ICD-10-CM

## 2016-03-29 DIAGNOSIS — M75121 Complete rotator cuff tear or rupture of right shoulder, not specified as traumatic: Secondary | ICD-10-CM | POA: Diagnosis not present

## 2016-03-29 DIAGNOSIS — M5440 Lumbago with sciatica, unspecified side: Secondary | ICD-10-CM

## 2016-03-29 DIAGNOSIS — M545 Low back pain, unspecified: Secondary | ICD-10-CM

## 2016-03-29 MED ORDER — NITROGLYCERIN 0.2 MG/HR TD PT24: MEDICATED_PATCH | TRANSDERMAL | 1 refills | Status: DC

## 2016-03-29 NOTE — Assessment & Plan Note (Signed)
The arthritic change is mild and not the primary reason for her shoulder pain at this time.  I think overuse led to another flare of rotator cuff sxs.

## 2016-03-29 NOTE — Assessment & Plan Note (Signed)
This has become servere enough to no longer respond to conservative care  I think a micro surgical approach to open narrowed space at L4/5 is a reasonable option although she could still have sxs from other levels

## 2016-03-29 NOTE — Assessment & Plan Note (Signed)
She has a recurrent injury to rotator cuff tendons.  Start cautious rehab with light weights and HEP NTG protocol  RTC 6 wkks

## 2016-03-29 NOTE — Progress Notes (Signed)
  Glenda Reese - 76 y.o. female MRN [id number]  Date of birth: 1940-07-14  SUBJECTIVE:  Including CC & ROS.  No chief complaint on file.  Glenda Reese is a 76 yo F w/ PMH lumbar spinal stenosis in for evaluation of "shoulder pain" and second opinion on surgical intervention for spinal stenosis. Reports 3 yr history of rotator cuff tendinopathy acutely worsened 3 months ago. States she stressed her shoulders while performing resisted abduction of hip during PT. Describes 4/10 "soreness" over R. AC joint. Pain worsened w/ abduction and external rotation. Patient has used nitroglycerin patches in the past w/ partial relief. No associated loss of sensation.   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 Continues to describe left-sided 7/10 gluteus "aching" pain w/ associated weakness. She has tried PT and injections w/ minimal relief. She is amenable to surgery at this time.   ROS There is radicular pain down post thigh and lateral lower leg on left No cough or sneeze pain Can get pain with sitting   HISTORY: Past Medical, Surgical, Social, and Family History Reviewed & Updated per EMR.   Pertinent Historical Findings include: L4-L5 degenerative spondylolysis lumbar spinal stenosis  Full thickness tear of Supraspinatus (2015)  DATA REVIEWED: MRI Lumbar Spine (12/17): Compression fracture of T12 vertebral body, multilevel degenerative spondylysis most significant at L4/L5  PHYSICAL EXAM:  VS: BP:(!) 157/75  HR:79bpm  TEMP: ( )  RESP:   HT:5\' 4"  (162.6 cm)   WT:115 lb (52.2 kg)  BMI:19.8 PHYSICAL EXAM:  General: well-appearing elderly F in NAD, A&Ox3  MSK:  - decreased bulk symmetrically - painful arc of shoulder abduction on R - 4/5 strength w/ pain to shoulder abduction on R - 5/5 strength to shoulder internal/external rotation bilaterally - Negative Hawkins/Neers - Positive  cross-arm on R + empty can on Rt  Studies: [place] Right Shoulder (03/29/16):  Partial tear of Supraspinatus at insertion on humeral head.  This is noted with hypoechoic change at distal insertion.  No retraction. Biceps tendon short snd long axis appears normal. The subscapularis also shows mild separation at distal insertion and on dynamic motion shows internal impingement and bursal swelling at coracoid Infraspinatus and teres minor are normal AC joint shows only mild DJD  Impression: Ultrasound findings consistent with rotator cuff tendinopathy with small distal tears of supraspinatus and subscapularis.  Ultrasound and interpretation by Wolfgang Phoenix. Glenda Corcino, MD   ASSESSMENT & PLAN: See problem based charting & AVS for pt instructions. 76 yo F w/ PMH of supraspinatus tear w/ shoulder pain worsened w/ abduction and evidence of supraspinatus/iand subscapularis partial tears on [place]. Will treat conservatively.   Back pain correlated clinically to spinal stenosis demonstrated on MR. Advised patient that micro surgical approach appears to be a reasonable option given her persistent symptoms but stressed that this may not resolve the pain.  1. Advised pt to perform RC exercises targeting abduction, internal/external rotation 2. 1/4 Nitroglycerin patch to shoulder daily 3. F/U 6 weeks to assess progress

## 2016-04-21 ENCOUNTER — Ambulatory Visit: Payer: Medicare HMO | Admitting: Sports Medicine

## 2016-05-26 ENCOUNTER — Ambulatory Visit: Payer: Medicare HMO | Admitting: Sports Medicine

## 2016-06-08 ENCOUNTER — Other Ambulatory Visit: Payer: Self-pay | Admitting: *Deleted

## 2016-06-08 MED ORDER — NITROGLYCERIN 0.2 MG/HR TD PT24: MEDICATED_PATCH | TRANSDERMAL | 1 refills | Status: DC

## 2016-06-30 ENCOUNTER — Ambulatory Visit: Payer: Self-pay

## 2016-06-30 ENCOUNTER — Ambulatory Visit (INDEPENDENT_AMBULATORY_CARE_PROVIDER_SITE_OTHER): Payer: Medicare HMO | Admitting: Sports Medicine

## 2016-06-30 VITALS — BP 123/62 | Ht 63.0 in | Wt 117.0 lb

## 2016-06-30 DIAGNOSIS — M7581 Other shoulder lesions, right shoulder: Secondary | ICD-10-CM

## 2016-06-30 DIAGNOSIS — M75121 Complete rotator cuff tear or rupture of right shoulder, not specified as traumatic: Secondary | ICD-10-CM

## 2016-06-30 DIAGNOSIS — G8929 Other chronic pain: Secondary | ICD-10-CM

## 2016-06-30 DIAGNOSIS — M25511 Pain in right shoulder: Secondary | ICD-10-CM

## 2016-06-30 MED ORDER — METHYLPREDNISOLONE ACETATE 40 MG/ML IJ SUSP
40.0000 mg | Freq: Once | INTRAMUSCULAR | Status: AC
  Administered 2016-06-30: 40 mg via INTRA_ARTICULAR

## 2016-07-01 ENCOUNTER — Encounter: Payer: Self-pay | Admitting: Sports Medicine

## 2016-07-03 DIAGNOSIS — M25511 Pain in right shoulder: Secondary | ICD-10-CM

## 2016-07-03 DIAGNOSIS — G8929 Other chronic pain: Secondary | ICD-10-CM | POA: Insufficient documentation

## 2016-07-03 NOTE — Assessment & Plan Note (Signed)
Performed intraarticular injection to glenohumeral joint.  Recommend follow up in 4 weeks and will get x-rays to assess joint space in the interim.

## 2016-07-03 NOTE — Assessment & Plan Note (Addendum)
Continue HEP, but pain appears to be coming from joint, not bursa.  Will get x-rays to assess joint space.

## 2016-07-03 NOTE — Progress Notes (Signed)
  Ryleigh Esqueda - 76 y.o. female MRN [id number]  Date of birth: Mar 07, 1940  SUBJECTIVE:  Including CC & ROS.  CC: right shoulder pain  Has a history of rotator cuff tear that has been treated with NTG and HEP.  She was doing very well with it until about a month ago while she started having a dull ache in the shoulder.  She has trouble with ROM.  She feels as though she has backtracked.  She is still doing the rehab.  She reports improvement, but still not where she was previously.  No numbness or tingling.   ROS: No unexpected weight loss, fever, chills, swelling, instability, muscle pain, numbness/tingling, redness, otherwise see HPI   PMHx - Updated and reviewed.  Contributory factors include: rotator cuff tear PSHx - Updated and reviewed.  Contributory factors include:  Negative FHx - Updated and reviewed.  Contributory factors include:  Negative Social Hx - Updated and reviewed. Contributory factors include: Negative Medications - reviewed   DATA REVIEWED: Previous office visits and ultrasounds with right rotator cuff tear  PHYSICAL EXAM:  VS: BP:123/62  HR: bpm  TEMP: ( )  RESP:   HT:5\' 3"  (160 cm)   WT:117 lb (53.1 kg)  BMI:20.8 PHYSICAL EXAM: Gen: NAD, alert, cooperative with exam, well-appearing HEENT: clear conjunctiva,  CV:  no edema, capillary refill brisk, normal rate Resp: non-labored Skin: no rashes, normal turgor  Neuro: no gross deficits.  Psych:  alert and oriented  Shoulder: Inspection reveals no abnormalities, atrophy or asymmetry. Palpation is normal with no tenderness over AC joint or bicipital groove. ROM is globally mildly decreased in all planes. Rotator cuff strength normal throughout. + signs of impingement with + Neer and Hawkin's tests, empty can sign. Speeds and Yergason's tests normal. No labral pathology noted with negative Obrien's, negative clunk and good stability. + painful arc but no drop arm sign.  Ultrasound of right  shoulder:  Biceps tendon appears normal fibrillar pattern, but has surrounding effusion appearing to come from joint. Subscapularis appears normal without obvious tears or abnormalities. The supraspinatus tendon appears to have hyperechoic changes throughout, but no discreet tears seen and has good fibrillary pattern.  The infraspinatus and teres minor tendons appear normal with no tears and a good footprints.  The subdeltoid/subacromial bursa is show swelling throughout and shows impingement with dynamic motion.   Ultrasound findings consistent with shoulder joint effusion  Ultrasound performed and interpreted by Melton Krebs, DO     ASSESSMENT & PLAN:   Right rotator cuff tendinitis Continue HEP, but pain appears to be coming from joint, not bursa.  Will get x-rays to assess joint space.  Chronic right shoulder pain Performed intraarticular injection to glenohumeral joint.  Recommend follow up in 4 weeks and will get x-rays to assess joint space in the interim.

## 2016-08-04 ENCOUNTER — Other Ambulatory Visit: Payer: Self-pay | Admitting: *Deleted

## 2016-08-04 DIAGNOSIS — G8929 Other chronic pain: Secondary | ICD-10-CM

## 2016-08-04 DIAGNOSIS — M7581 Other shoulder lesions, right shoulder: Secondary | ICD-10-CM

## 2016-08-04 DIAGNOSIS — M25511 Pain in right shoulder: Secondary | ICD-10-CM

## 2016-08-09 ENCOUNTER — Ambulatory Visit (INDEPENDENT_AMBULATORY_CARE_PROVIDER_SITE_OTHER): Payer: Medicare HMO

## 2016-08-09 DIAGNOSIS — M25511 Pain in right shoulder: Secondary | ICD-10-CM

## 2016-08-09 DIAGNOSIS — M75101 Unspecified rotator cuff tear or rupture of right shoulder, not specified as traumatic: Secondary | ICD-10-CM | POA: Diagnosis not present

## 2016-08-09 DIAGNOSIS — G8929 Other chronic pain: Secondary | ICD-10-CM

## 2016-08-09 DIAGNOSIS — M7581 Other shoulder lesions, right shoulder: Secondary | ICD-10-CM

## 2016-08-10 ENCOUNTER — Encounter: Payer: Self-pay | Admitting: *Deleted

## 2016-08-23 ENCOUNTER — Ambulatory Visit: Payer: Medicare HMO | Admitting: Sports Medicine

## 2016-09-01 ENCOUNTER — Ambulatory Visit (INDEPENDENT_AMBULATORY_CARE_PROVIDER_SITE_OTHER): Payer: Medicare HMO | Admitting: Sports Medicine

## 2016-09-01 VITALS — BP 100/60

## 2016-09-01 DIAGNOSIS — M7581 Other shoulder lesions, right shoulder: Secondary | ICD-10-CM | POA: Diagnosis not present

## 2016-09-01 NOTE — Patient Instructions (Signed)
    It was great seeing you today, keep up the great work with your shoulder range of motion exercises. Please follow up as needed.

## 2016-09-01 NOTE — Progress Notes (Signed)
   Subjective:    Patient ID: Glenda Reese, female    DOB: 1940-06-13, 76 y.o.   MRN: [id number]  HPI CC- right shoulder follow up  Patient states she has done well in the interim. She improved after shoulder injection and had been using nitro patches up until 2-3 weeks ago when she went on vacation. Pain is minimal and only occurs with certain over the head motions when doing tai chi. She denies weakness in her arm. Incidentally she received a course of steroids after a minimally invasive back surgery which helped to reduce the pain in her shoulder quite a bit as well.  Review of Systems- no neck pain, numbness or tingling in upper extremities      Objective:   Physical Exam  Well nourished, well developed in NAD  Right shoulder- no erythema or swelling. Mildly tender to palpation at right [facility] joint. Full ROM, some tenderness with right arm abduction. Positive empty can test. Normal strength with internal, external rotation and shoulder abduction. Normal biceps strength. Neurovascularly in tact.     Assessment & Plan:   Right rotator cuff tendinitis- improved -continue shoulder ROM exercises and tai chi activities to prevent future flare ups -discontinue nitro patches for now -follow up as needed if pain worsens or recurs

## 2017-06-22 ENCOUNTER — Encounter: Payer: Self-pay | Admitting: Sports Medicine

## 2017-06-26 ENCOUNTER — Encounter: Payer: Self-pay | Admitting: Sports Medicine

## 2017-06-26 ENCOUNTER — Ambulatory Visit: Payer: Medicare HMO | Admitting: Sports Medicine

## 2017-06-26 VITALS — BP 98/68 | Ht 63.0 in | Wt 116.0 lb

## 2017-06-26 DIAGNOSIS — S82832A Other fracture of upper and lower end of left fibula, initial encounter for closed fracture: Secondary | ICD-10-CM | POA: Diagnosis not present

## 2017-06-26 DIAGNOSIS — S8265XA Nondisplaced fracture of lateral malleolus of left fibula, initial encounter for closed fracture: Secondary | ICD-10-CM | POA: Diagnosis not present

## 2017-06-26 DIAGNOSIS — S82402A Unspecified fracture of shaft of left fibula, initial encounter for closed fracture: Secondary | ICD-10-CM | POA: Insufficient documentation

## 2017-06-26 NOTE — Assessment & Plan Note (Signed)
Convert to air cast splint She walked without pain in this and without walker  D/C knee braces  Reck 3 wks with repeat XR

## 2017-06-26 NOTE — Progress Notes (Signed)
   HPI  CC: Right Knee pain, Left Ankle Pain after a fall  Patient presents today one week after a right knee and left ankle injury secondary to fall. Patient was walking down to the basement of her house one week ago with her sunglasses on and it was dark. She thought she reached the bottom step but there were 2 more steps which she fell down, she believes she may have twisted her left ankle and landed on her right knee. She was seen in urgent care as well as orthopedic surgery. She endorses concern that she was advised to wear a knee brace as well as a non-weight-bearing ankle boot and she has very limited mobility with both of these on.  Right Knee:  Patient endorses some knee swelling which has improved over the past few days.  Left Ankle:  She endorses pain and swelling as well as bruising of the left ankle. She reports she has not been wearing the ankle boot due to inability to get around. Additionally she reports discomfort due to the elevation of the boot, which causes her to have unbalanced gait and this leads to flaring of her chronic arthritic back pain. Using a walker to ambulate  Cc, family history, smoking history reviewed.  ROS: Per HPI; in addition no fever, no rash, no additional weakness, no additional numbness, no additional paresthesias, and no additional falls/injury.   Objective: BP 98/68   Ht 5\' 3"  (1.6 m)   Wt 116 lb (52.6 kg)   BMI 20.55 kg/m  Gen: NAD, well groomed, a/o x3, normal affect.  CV: Well-perfused. Warm.  Resp: Non-labored.  Neuro: Sensation intact throughout. No gross coordination deficits.  Left Ankle: +diffuse ecchymosis, swelling and warmth over the ankle, foot and toes. +mild diffuse tenderness superior to the lateral and medial malleolus, ROM not tested due to acute fracture. Strong distal pulses, sensation intact distally.  Right Knee: +mild edema. Palpation without warmth. No joint line tenderness. No tenderness to palpation or percussion at  the patella. No patella compression tenderness. Full ROM at the knee. 5/5 strength quadriceps and hamstring strength. Ligaments with solid and consistent end points.   XRay results: 1. XR ankle 2 views Left Distal fibula fracture at joint line tib/fib articulation Non displaced  2. XR Knee 3 views Right Read as possible inferior patella fracture >> however these may be consistent with chronic arthritic changes/ on my review I do not think there is a true fracture  Assessment and Plan:  Left distal fibular fracture  - patient continues to have swelling and ecchymosis at the ankle as expected with an acute fracture 1 week ago.  - air splint placed in office today - recommend ice - elevate frequently - follow up XR and office visit in 3 weeks - advised to use a sock on the other foot to even out gait as the elevation from the splint may aggravate her chronic back arthritis  R knee pain - changes on XR appear to be chronic. Patient does not have any tenderness to palpation or percussion over the patella in the office. XR finding does not seem to be an acute injury. - no brace necessary  Everrett Coombe, MD PGY-2 [facility] I observed and examined the patient with the resident and agree with assessment and plan.  Note reviewed and modified by me. Stefanie Libel, MD

## 2017-06-27 ENCOUNTER — Ambulatory Visit: Payer: Medicare HMO | Admitting: Sports Medicine

## 2017-06-27 ENCOUNTER — Encounter

## 2017-06-28 ENCOUNTER — Telehealth: Payer: Self-pay | Admitting: *Deleted

## 2017-06-28 DIAGNOSIS — G8929 Other chronic pain: Secondary | ICD-10-CM

## 2017-06-28 DIAGNOSIS — M25572 Pain in left ankle and joints of left foot: Secondary | ICD-10-CM

## 2017-06-28 DIAGNOSIS — M25561 Pain in right knee: Principal | ICD-10-CM

## 2017-06-28 NOTE — Telephone Encounter (Signed)
Order faxed to [facility]

## 2017-07-18 ENCOUNTER — Ambulatory Visit (INDEPENDENT_AMBULATORY_CARE_PROVIDER_SITE_OTHER): Payer: Medicare HMO

## 2017-07-18 DIAGNOSIS — X58XXXD Exposure to other specified factors, subsequent encounter: Secondary | ICD-10-CM

## 2017-07-18 DIAGNOSIS — S82832D Other fracture of upper and lower end of left fibula, subsequent encounter for closed fracture with routine healing: Secondary | ICD-10-CM

## 2017-07-18 DIAGNOSIS — S82832A Other fracture of upper and lower end of left fibula, initial encounter for closed fracture: Secondary | ICD-10-CM

## 2017-07-20 ENCOUNTER — Encounter: Payer: Self-pay | Admitting: Sports Medicine

## 2017-07-20 ENCOUNTER — Ambulatory Visit: Payer: Medicare HMO | Admitting: Sports Medicine

## 2017-07-20 VITALS — BP 130/70 | Ht 63.0 in | Wt 118.0 lb

## 2017-07-20 DIAGNOSIS — M25572 Pain in left ankle and joints of left foot: Secondary | ICD-10-CM

## 2017-07-20 DIAGNOSIS — S8265XD Nondisplaced fracture of lateral malleolus of left fibula, subsequent encounter for closed fracture with routine healing: Secondary | ICD-10-CM | POA: Diagnosis not present

## 2017-07-20 NOTE — Progress Notes (Signed)
   HPI  CC: Left distal fibular fracture follow-up Patient is here to follow-up regarding her left-sided distal fibular fracture.  Repeat x-rays obtained 2 days ago showed no displacement, there is also some bony callus formation present consistent with fracture healing.  There was some sclerotic change to the medial malleolus which did not coincide with any pain expressed by the patient, so is likely an incidental finding.  Patient endorses good compliance with the Aircast provided at the last visit.  She has been gradually more active.  She endorses excellent pain control and states that she really has no pain at this time.  She continues to use 2 walking sticks to help her mobilize.  She denies any recent setbacks.  No new trauma or falls.  No weakness, numbness, or paresthesias.  Patient does endorse some slight right low back/buttock discomfort.  She is worried this may be caused by some of the positioning she has been forced to be in due to the injury.  She denies any radiating symptoms from the sites.  No bowel or bladder incontinence.  Medications/Interventions Tried: Aircast, RICE therapy  See HPI and/or previous note for associated ROS.  Objective: BP 130/70   Ht 5\' 3"  (1.6 m)   Wt 118 lb (53.5 kg)   BMI 20.90 kg/m  Gen: NAD, well groomed, a/o x3, normal affect.  CV: Well-perfused. Warm.  Resp: Non-labored.  Neuro: Sensation intact throughout. No gross coordination deficits.  Gait: Nonpathologic posture, unremarkable stride without signs of limp or balance issues. Ankle/Foot, Left: Little to no TTP with palpation over the distal fibula, medial malleolus, or talar dome. Minimal erythema and swelling compared to contralateral side.  Increased warmth compared to contralateral side. No evidence of tibiotalar deviation; Range of motion is full in all directions. Strength is 5/5 in all directions. No peroneal tendon tenderness or subluxation; No tenderness on posterior aspects of lateral  and medial malleolus; Stable lateral and medial ligaments; Unremarkable squeeze and kleiger tests; No pain at base of 5th MT; No tenderness at the distal metatarsals; Able to walk 4 steps.    Assessment and plan:  Closed left fibular fracture Patient is here for follow-up regarding her distal fibular fracture on the left side.  Repeat x-rays showed bony callus formation and patient is currently pain-free.  Very impressive signs of healing noted today.  No recent setbacks. -Activity as tolerated.  Use pain as guide. -RICE therapy as needed -Continue use of Aircast x3 weeks -Follow-up in 3 weeks  -Order placed to have patient obtain new radiographs prior to follow-up.   Orders Placed This Encounter  Procedures  . DG Tibia/Fibula Left    Standing Status:   Future    Standing Expiration Date:   09/20/2018    Order Specific Question:   Reason for Exam (SYMPTOM  OR DIAGNOSIS REQUIRED)    Answer:   follow up left fib fx    Order Specific Question:   Preferred imaging location?    Answer:   [facility]    Order Specific Question:   Radiology Contrast Protocol - do NOT remove file path    Answer:   [email or web address]    Elberta Leatherwood, MD,MS [facility] Fellow 07/20/2017 5:44 PM   I observed and examined the patient with the [facility] Fellow and agree with assessment and plan.  Note reviewed and modified by me. Stefanie Libel, MD

## 2017-07-20 NOTE — Assessment & Plan Note (Signed)
Patient is here for follow-up regarding her distal fibular fracture on the left side.  Repeat x-rays showed bony callus formation and patient is currently pain-free.  Very impressive signs of healing noted today.  No recent setbacks. -Activity as tolerated.  Use pain as guide. -RICE therapy as needed -Continue use of Aircast x3 weeks -Follow-up in 3 weeks  -Order placed to have patient obtain new radiographs prior to follow-up.

## 2017-08-07 ENCOUNTER — Ambulatory Visit (INDEPENDENT_AMBULATORY_CARE_PROVIDER_SITE_OTHER): Payer: Medicare HMO

## 2017-08-07 DIAGNOSIS — M25572 Pain in left ankle and joints of left foot: Secondary | ICD-10-CM

## 2017-08-07 DIAGNOSIS — X58XXXD Exposure to other specified factors, subsequent encounter: Secondary | ICD-10-CM

## 2017-08-07 DIAGNOSIS — S8262XD Displaced fracture of lateral malleolus of left fibula, subsequent encounter for closed fracture with routine healing: Secondary | ICD-10-CM

## 2017-08-08 ENCOUNTER — Ambulatory Visit: Payer: Medicare HMO | Admitting: Sports Medicine

## 2017-08-08 VITALS — BP 139/72 | Ht 63.0 in | Wt 117.0 lb

## 2017-08-08 DIAGNOSIS — S8265XD Nondisplaced fracture of lateral malleolus of left fibula, subsequent encounter for closed fracture with routine healing: Secondary | ICD-10-CM | POA: Diagnosis not present

## 2017-08-09 ENCOUNTER — Encounter: Payer: Self-pay | Admitting: Sports Medicine

## 2017-08-09 NOTE — Progress Notes (Signed)
   Subjective:    Patient ID: Glenda Reese, female    DOB: [birth year], 77 y.o.   MRN: [id number]  HPI   Patient comes in today for follow-up on a left ankle bimalleolar fracture.I see her in lieu of Dr. Oneida Alar absence. She is 7 weeks out from her injury. Overall, she is doing well. Her Aircast was uncomfortable so she purchased an ankle wrap which is much better for her. She's complaining of some stiffness in the left ankle. She thinks that is leading to some posterior hip pain. Over the past couple of weeks she's had some pain primarily in the left buttock. She had a massage a few days ago and was told about a massage therapist that it was probably her hamstring. She felt much better after her massage but her pain is starting to return. She believes it is from an altered gait from her ankle fracture. She denies groin pain.    Review of Systems As above    Objective:   Physical Exam  Well-developed, well-nourished. No acute distress. She is sitting comfortably in exam room.  Left hip: Smooth painless hip range of motion with a negative logroll. She does have some hip abductor weakness and a positive Trendelenburg. Slight tenderness to palpation at the origin of the hamstring tendon. No palpable defect.  Left ankle: Decreased range of motion in all planes. Mild diffuse soft tissue swelling. No tenderness to palpation over the medial malleolus nor over the distal fibula. Ankle is stable ligamentous exam. Strength was not tested. Good pulses. Patient walks with a bit of an unsteady gait but does not appear to be limping.  X-rays of the left tib-fib are reviewed. Bimalleolar fracture continues to heal. Medial malleolus fracture appears completely healed and distal fibular fracture has significant callus formation      Assessment & Plan:   7 weeks status post healing bimalleolar fracture, left ankle Proximal hamstring strain likely secondary to altered gait  Physical therapy is coming to  this patient's house but it does not sound like she is doing much in the way of range of motion. She is certainly not doing any strengthening. We've given her a comprehensive set of range of motion exercises to start doing daily. I also gave her a single hamstring exercise to start doing daily as well. In addition, we will give her some clamshell exercises. Follow-up again in 3 weeks for reevaluation.  She will continue with her compression wrap around the ankle when active. Call with questions or concerns prior to her follow-up visit.

## 2017-08-10 ENCOUNTER — Encounter: Payer: Self-pay | Admitting: Sports Medicine

## 2017-08-15 ENCOUNTER — Ambulatory Visit: Payer: Medicare HMO | Admitting: Sports Medicine

## 2017-08-28 ENCOUNTER — Ambulatory Visit (INDEPENDENT_AMBULATORY_CARE_PROVIDER_SITE_OTHER): Payer: Medicare HMO

## 2017-08-28 ENCOUNTER — Telehealth: Payer: Self-pay | Admitting: *Deleted

## 2017-08-28 DIAGNOSIS — S8265XD Nondisplaced fracture of lateral malleolus of left fibula, subsequent encounter for closed fracture with routine healing: Secondary | ICD-10-CM

## 2017-08-28 DIAGNOSIS — X58XXXD Exposure to other specified factors, subsequent encounter: Secondary | ICD-10-CM

## 2017-08-28 NOTE — Telephone Encounter (Signed)
Order place in epic

## 2017-08-31 ENCOUNTER — Other Ambulatory Visit: Payer: Self-pay | Admitting: *Deleted

## 2017-08-31 ENCOUNTER — Ambulatory Visit (INDEPENDENT_AMBULATORY_CARE_PROVIDER_SITE_OTHER): Payer: Medicare HMO

## 2017-08-31 ENCOUNTER — Ambulatory Visit (INDEPENDENT_AMBULATORY_CARE_PROVIDER_SITE_OTHER): Payer: Medicare HMO | Admitting: Sports Medicine

## 2017-08-31 VITALS — BP 117/61 | Ht 63.0 in | Wt 117.0 lb

## 2017-08-31 DIAGNOSIS — S8265XD Nondisplaced fracture of lateral malleolus of left fibula, subsequent encounter for closed fracture with routine healing: Secondary | ICD-10-CM

## 2017-08-31 DIAGNOSIS — X58XXXD Exposure to other specified factors, subsequent encounter: Secondary | ICD-10-CM

## 2017-08-31 NOTE — Patient Instructions (Addendum)
-  Maintain a neutral spine when exercising -Ok to use a recumbent bike -Ok to do water exercises -Limit your walking to 20 minutes every other day -Complete the ankle strengthening exercises you were given -Get another ankle x-ray before your next appointment in 4 weeks

## 2017-09-01 NOTE — Progress Notes (Signed)
   Subjective:    Patient ID: Glenda Reese, female    DOB: [birth year], 77 y.o.   MRN: [id number]  HPI  Glenda Reese comes in today for follow-up on her left ankle fracture. She continues to improve. Minimal pain. Ambulating much easier. Range of motion is improving. She has still not started any strengthening exercises. She has returned to doing her tai chi but has questions for me regarding other types of exercise that may benefit her.   Review of Systems    as above Objective:   Physical Exam  Well-developed, well-nourished. No acute distress. Awake alert and oriented 3. Vital signs reviewed  Left ankle: There is very minimal tenderness to palpation along the distal fibula. No soft tissue swelling and no effusion. She has regained nearly complete active dorsiflexion and plantar flexion. Still lacks some eversion and inversion. There is no tenderness to palpation over the medial malleolus. Pulses intact.  X-rays of the left ankle including AP, lateral, and mortise views shows further healing of the distal fibular fracture. Fracture line still evident however. Medial malleolar fracture has completely healed. Mortise is intact.      Assessment & Plan:   10 weeks status post nondisplaced distal fibular fracture with concomitant medial malleolar fracture  Although the patient continues to do well, she still has an obvious fracture line on her x-rays. Patient will start strengthening exercises and I've encouraged her to improve her conditioning either with water exercises or on a recumbent bike. This patient also his a history of 3 previous lumbar spine surgeries so she understands the importance of keeping a neutral spine with any sort of exercise. She is also interested in Pilates so I gave her the name of Glenda Reese. However, she lives in [place] and may find it more convenient to work out there. She is an avid walker and is asking about return to recreational walking. I've explained to  her that she needs to be careful but she may start some limited walking at 20 minutes every other day as long as her ankle does not hurt. She will need to continue wearing her ankle support and follow-up with me in 4 weeks for reevaluation. She will have follow-up ankle x-rays done prior to that visit.

## 2017-10-02 ENCOUNTER — Ambulatory Visit: Payer: Medicare HMO | Admitting: Sports Medicine

## 2017-10-30 ENCOUNTER — Ambulatory Visit (INDEPENDENT_AMBULATORY_CARE_PROVIDER_SITE_OTHER): Payer: Medicare HMO

## 2017-10-30 DIAGNOSIS — X58XXXD Exposure to other specified factors, subsequent encounter: Secondary | ICD-10-CM | POA: Diagnosis not present

## 2017-10-30 DIAGNOSIS — S8265XD Nondisplaced fracture of lateral malleolus of left fibula, subsequent encounter for closed fracture with routine healing: Secondary | ICD-10-CM | POA: Diagnosis not present

## 2017-11-02 ENCOUNTER — Encounter: Payer: Self-pay | Admitting: Sports Medicine

## 2017-11-02 ENCOUNTER — Ambulatory Visit: Payer: Medicare HMO | Admitting: Sports Medicine

## 2017-11-02 VITALS — BP 104/58 | Ht 63.0 in | Wt 117.0 lb

## 2017-11-02 DIAGNOSIS — M8580 Other specified disorders of bone density and structure, unspecified site: Secondary | ICD-10-CM | POA: Insufficient documentation

## 2017-11-02 DIAGNOSIS — M8589 Other specified disorders of bone density and structure, multiple sites: Secondary | ICD-10-CM

## 2017-11-02 DIAGNOSIS — S8265XD Nondisplaced fracture of lateral malleolus of left fibula, subsequent encounter for closed fracture with routine healing: Secondary | ICD-10-CM

## 2017-11-02 NOTE — Assessment & Plan Note (Signed)
-   reviewed recent x-rays from earlier this week - significant callus formation of Left distal fibula consistent with healed fracture - original incident was in April 2019 - clear to resume normal activities, does not need a brace - discussed doing ABC's with ankle to help with ankle stiffness - reviewed ankle strengthening exercises with calf raises and band exercises to be performed daily - follow up PRN unless in need of new orthotics

## 2017-11-02 NOTE — Progress Notes (Signed)
  Glenda Reese - 77 y.o. female MRN [id number]  Date of birth: Apr 17, 1940   Chief complaint: Left distal fibula fracture follow-up  SUBJECTIVE:    History of present illness: Patient reports today to follow-up of a left distal fibula fracture.  Her original fracture occurred in April 2019.  She does have a history of osteopenia.  She has been treated with immobilization and serial x-ray examinations.  She now is approximately 5 months status post a fracture.  She recently had a x-ray performed earlier this week which demonstrated significant callus formation and a healed Weber A fracture of the left ankle.  Patient states she has not been in the cam walker boot for several weeks now.  She tried wearing a ankle brace however this was uncomfortable.  She has been ambulating normally without any significant pain or discomfort.  She feels better than prior to when she injured herself.  She denies any new symptoms of knee pain or numbness or tingling.  She states she is having some ankle stiffness and feels slightly weak when performing her tai chi exercises.  Denies any new injuries.  She has been performing ankle stretches and strengthening exercises which have helped significantly.   Review of systems:  As stated above   Interval past medical history, surgical history, family history, and social history obtained and are unchanged.   Pertinent past medical history include: Osteopenia.  She is a non-smoker.  Medications reviewed and unchanged. Allergies reviewed and unchanged.  OBJECTIVE:  Physical exam: Vital signs are reviewed. BP (!) 104/58   Ht '5\' 3"'$  (1.6 m)   Wt 117 lb (53.1 kg)   BMI 20.73 kg/m   Gen.: Alert, oriented, appears stated age, in no apparent distress Neurologic: sensation intact to light touch L4-S1 Gait: normal without associated limp Musculoskeletal: Inspection of the left ankle demonstrates no acute abnormalities or deformity.  She has no tenderness to palpation over her  distal fibula or proximal fibular head.  She has full range of motion in ankle dorsiflexion plantarflexion although subjectively she feels stiff.  Strength testing is 4.5 out of 5 in ankle plantarflexion and 5 out of 5 in dorsiflexion.  Her ankle is stable to ligamentous testing.  She is neurovascularly intact.    ASSESSMENT & PLAN: Closed left fibular fracture - reviewed recent x-rays from earlier this week - significant callus formation of Left distal fibula consistent with healed fracture - original incident was in April 2019 - clear to resume normal activities, does not need a brace - discussed doing ABC's with ankle to help with ankle stiffness - reviewed ankle strengthening exercises with calf raises and band exercises to be performed daily - follow up PRN unless in need of new orthotics  Osteopenia - one reason for delayed healing of bones - continue with core strengthening and balance exercises to prevent fractures   Clydene Laming, [place]  I observed and examined the patient with the [facility] Fellow and agree with assessment and plan.  Note reviewed and modified by me. Ila Mcgill, MD

## 2017-11-02 NOTE — Assessment & Plan Note (Signed)
-   one reason for delayed healing of bones - continue with core strengthening and balance exercises to prevent fractures

## 2018-01-04 ENCOUNTER — Ambulatory Visit: Payer: Self-pay

## 2018-01-04 ENCOUNTER — Encounter: Payer: Self-pay | Admitting: Sports Medicine

## 2018-01-04 ENCOUNTER — Ambulatory Visit: Payer: Medicare HMO | Admitting: Sports Medicine

## 2018-01-04 VITALS — BP 102/62 | Ht 63.0 in | Wt 117.0 lb

## 2018-01-04 DIAGNOSIS — G8929 Other chronic pain: Secondary | ICD-10-CM

## 2018-01-04 DIAGNOSIS — M25511 Pain in right shoulder: Principal | ICD-10-CM

## 2018-01-04 MED ORDER — NITROGLYCERIN 0.2 MG/HR TD PT24: MEDICATED_PATCH | TRANSDERMAL | 1 refills | Status: DC

## 2018-01-04 NOTE — Patient Instructions (Addendum)
Your shoulder pain is due to rotator cuff tendinopathy. As discussed, avoid any overhead exercise with weights.  Generally, avoid exercise activities that increase her pain. Do the home exercises once daily about 4 days/week. We will start the nitroglycerin patches.  Follow the protocol below We will have you follow-up in 4 weeks.  At that time we can make your orthotics.   Nitroglycerin Protocol   Apply 1/4 nitroglycerin patch to affected area daily.  Change position of patch within the affected area every 24 hours.  You may experience a headache during the first 1-2 weeks of using the patch, these should subside.  If you experience headaches after beginning nitroglycerin patch treatment, you may take your preferred over the counter pain reliever.  Another side effect of the nitroglycerin patch is skin irritation or rash related to patch adhesive.  Please notify our office if you develop more severe headaches or rash, and stop the patch.  Tendon healing with nitroglycerin patch may require 12 to 24 weeks depending on the extent of injury.  Men should not use if taking Viagra, Cialis, or Levitra.   Do not use if you have migraines or rosacea.

## 2018-01-04 NOTE — Progress Notes (Signed)
  Glenda Reese - 77 y.o. female MRN [id number]  Date of birth: [birth year]    SUBJECTIVE:      Chief Complaint:/ HPI:  77 year old female with right shoulder pain.  She has been having increased right shoulder pain for approximately 6 weeks.  Pain began following an exercise class that involves lots of shoulder exercises, to include overhead exercises, with 1 pound weight.  She has increased pain with general activity, in particular she has pain with lifting any weight overhead.  Her pain is improved with rest.  Occasional NSAIDs are somewhat helpful.  She reports being told in the past that she had a rotator cuff tear and arthritis in her shoulder.  She denies any associated swelling or erythema.  No numbness tingling the hand.  No associated skin changes   ROS:     See HPI  PERTINENT  PMH / PSH FH / / SH:  Past Medical, Surgical, Social, and Family History Reviewed & Updated in the EMR.   OBJECTIVE: BP 102/62   Ht 5\' 3"  (1.6 m)   Wt 117 lb (53.1 kg)   BMI 20.73 kg/m   Physical Exam:  Vital signs are reviewed.  GEN: Alert and oriented, NAD Pulm: Breathing unlabored PSY: normal mood, congruent affect  MSK: Right shoulder: No obvious deformity or asymmetry. No bruising. No swelling No significant tenderness Full ROM in flexion, abduction, internal/external rotation.  Mild pain with abduction and flexion above 90 degrees NV intact distally Special Tests:  - Impingement: Neg Hawkins and Neers.  - Supraspinatous: Mildly positive empty can.  4+/5 strength - Infraspinatous/Teres: 5/5 strength with ER - Subscapularis: Equivocal belly press, pain with bear hug.  4+ /5 strength with IR - Biceps tendon: Negative Speeds.  - Labrum: Negative Obriens.  MSK [place]: Ultrasound of Shoulder-right shoulder  BT short: Biceps tendon and short view is intact without tenosynovitis.  There arthritic changes seen at the bicipital groove BT long: On longitudinal view, the biceps tendon there are  interstitial hypoechoic changes Supraspinatus tendon: Tendon appears intact.  There are hypoechoic interstitial changes seen throughout.  No focal tears. Subscapularis tendon: Tendon is intact also with interstitial hypoechoic changes and areas of calcification Infraspinatus tendon: Tendon is intact without tears or degenerative changes Teres Minor tendon: Intact without tears or degenerative changes AC joint: Arthritic changes at the [facility] joint.  Effusion noted. [place] joint: The glenohumeral joint is visualized without obvious arthritis.  No obvious labral pathology seen posteriorly.  Summary and Additional findings-chronic degenerative changes in the biceps tendon, supraspinatus, and subscapularis tendons consistent with chronic rotator cuff tendinopathy.   ASSESSMENT & PLAN:  1.  Right shoulder pain secondary to rotator cuff tendinopathy.  Ultrasound as detailed above. - Avoid overhead exercise with weight -Nitroglycerin patch -Home exercise for rotator cuff strengthening -Follow-up in 4 weeks.  Consider subacromial injection if continues to be symptomatically  Patient seen and evaluated with the sports medicine fellow.  I agree with the above plan of care.  Patient's ultrasound does not show evidence of a full-thickness rotator cuff tear.  Proceed with treatment as above and follow-up in 4 weeks.  I did discuss the possibility of subacromial cortisone injection if symptoms persist.  Call with questions or concerns prior to follow-up.

## 2018-02-06 ENCOUNTER — Encounter: Payer: Medicare HMO | Admitting: Sports Medicine

## 2018-03-05 ENCOUNTER — Encounter: Payer: Medicare HMO | Admitting: Sports Medicine

## 2018-03-20 ENCOUNTER — Ambulatory Visit: Payer: Medicare HMO | Admitting: Sports Medicine

## 2018-03-20 VITALS — BP 104/60

## 2018-03-20 DIAGNOSIS — M25511 Pain in right shoulder: Secondary | ICD-10-CM | POA: Diagnosis not present

## 2018-03-20 DIAGNOSIS — M545 Low back pain, unspecified: Secondary | ICD-10-CM

## 2018-03-20 DIAGNOSIS — G8929 Other chronic pain: Secondary | ICD-10-CM | POA: Diagnosis not present

## 2018-03-21 ENCOUNTER — Encounter: Payer: Self-pay | Admitting: Sports Medicine

## 2018-03-21 NOTE — Progress Notes (Signed)
   Subjective:    Patient ID: Glenda Reese, female    DOB: March 20, 1940, 78 y.o.   MRN: [id number]  HPI   Glenda Reese comes in today for follow-up on right shoulder pain.  Her pain is slowly improving.  She admits that she has been rather noncompliant up until the beginning of this month when she finally started doing her home exercises and using her nitroglycerin patches.  As a result, her shoulder pain is improving. Main question today is about orthotics for her shoes.  She has several different types of shoes and has tried a couple of different off-the-shelf orthotics.  Some of these are comfortable but some are not.  She is asking my advice about what type of shoes she should wear and whether or not I think orthotics would be helpful.  She has had custom orthotics in the past but they were uncomfortable, especially at the heel where she felt they were too thick.  She feels like arch support would be helpful for her low back.  She has scoliosis and will experience pain from time to time.  Review of Systems As above    Objective:   Physical Exam  Well-developed, well-nourished.  No acute distress.  Awake alert and oriented x3.  Vital signs reviewed  Right shoulder: Good range of motion.  Good strength.  Minimal pain with rotator cuff stressing  Examination of her feet show a flexible cavus foot with forefoot adduction.      Assessment & Plan:  Improving right shoulder pain secondary to rotator cuff tendinopathy Flexible cavus foot Scoliosis  Patient will continue with her nitroglycerin patches and home exercises for her right shoulder.  Given her cavus foot, I explained to Glenda Reese that she definitely needs good arch support.  Some of her shoes have medial support and others do not.  I have given her a pair of green sports insoles with scaphoid pads to experiment with in her shoes.  Hopefully this will help with her low back pain.  She will follow-up with me in 4 weeks for reevaluation of both her  right shoulder and her inserts.  If she finds the green inserts with scaphoid pads to be comfortable, we will proceed with custom orthotics (I will construct these out of the Smart Cell orthotic as she has had trouble with thicker orthotics in the past).

## 2018-04-16 ENCOUNTER — Encounter: Payer: Medicare HMO | Admitting: Sports Medicine

## 2018-05-03 ENCOUNTER — Other Ambulatory Visit: Payer: Self-pay

## 2018-05-03 ENCOUNTER — Encounter: Payer: Self-pay | Admitting: Sports Medicine

## 2018-05-03 ENCOUNTER — Ambulatory Visit (INDEPENDENT_AMBULATORY_CARE_PROVIDER_SITE_OTHER): Payer: Medicare HMO | Admitting: Sports Medicine

## 2018-05-03 DIAGNOSIS — M12811 Other specific arthropathies, not elsewhere classified, right shoulder: Secondary | ICD-10-CM

## 2018-05-03 DIAGNOSIS — M79672 Pain in left foot: Secondary | ICD-10-CM

## 2018-05-03 DIAGNOSIS — R269 Unspecified abnormalities of gait and mobility: Secondary | ICD-10-CM | POA: Insufficient documentation

## 2018-05-03 DIAGNOSIS — G8929 Other chronic pain: Secondary | ICD-10-CM

## 2018-05-03 DIAGNOSIS — M75101 Unspecified rotator cuff tear or rupture of right shoulder, not specified as traumatic: Secondary | ICD-10-CM

## 2018-05-03 NOTE — Assessment & Plan Note (Signed)
Repeat US today Clinical evaluation shows good RC function but weakness and pain on impingement testing  Ultrasound of Right Shoulder There is a retracted tear of supraspinatus DJD noted over superior humeral head Dynamic scan shows migration of humeral head against acromium  Subscapularis reveals complete tear with retraction and psuedo- bursa formation inferior to coracoid  Infrapinatus and teres minor normal Biceps tendon normal AC joint mild DJD  Impression - Full thickness anterior and superior rotator cuff tear that has retracted significantly since last MRI and [place].  Ultrasound and interpretation by Wolfgang Phoenix. Oneida Alar, MD

## 2018-05-03 NOTE — Assessment & Plan Note (Signed)
Patient was fitted for a : standard, cushioned, semi-rigid orthotic. The orthotic was heated and afterward the patient stood on the orthotic blank positioned on the orthotic stand. The patient was positioned in subtalar neutral position and 10 degrees of ankle dorsiflexion in a weight bearing stance.  The blank was ground to a stable position for weight bearing. Size: 8 red supercell forefoot Base: used built-in brace Posting: none Additional orthotic padding: none  After completion of these she felt much more cushion. She will test these over next month and continue if this is helping.

## 2018-05-03 NOTE — Progress Notes (Signed)
CC: burning pain over left foot  Patient w Hx of lumbar disc Dz.  Had herniated disc that led to foot drop on Rt. Occasionally gets radiating pain into left leg and foot.  Now having more foot pain with burning and sometimes some numbness.  This was worse with sports insole and scaphoid pads.  This is starting to limit her walking.    Made better by Qigong positions and exercises. Made better by gentle stretches. Worsened after left fibular fracture in May 19.  Boot caused irritation and we switched her to aircast.  ROS No falls No weakness in left LE Improves after sleepin g Improved in certain shoes  PE  Thin F in NAD BP 132/70    Foot drop on RT with minimal active dorsiflexion  RT bunionette 2nd toe varus shift underlaps first toe Cavus shape  Lt 5th toe and ray are separated and rotated Toes 2 and 3 with varus shift Hammer at PIP Of 2 Small hammer on 5  No hypersensitivity to palpation of foot  Gait:  No limp noted; dorsiflexion on RT foot is limited but no stumbling

## 2018-12-27 ENCOUNTER — Other Ambulatory Visit: Payer: Self-pay

## 2018-12-27 ENCOUNTER — Ambulatory Visit: Payer: Medicare HMO | Admitting: Sports Medicine

## 2018-12-27 ENCOUNTER — Ambulatory Visit: Payer: Self-pay

## 2018-12-27 VITALS — BP 108/60 | Ht 63.0 in | Wt 112.0 lb

## 2018-12-27 DIAGNOSIS — M25512 Pain in left shoulder: Secondary | ICD-10-CM

## 2018-12-27 DIAGNOSIS — M12811 Other specific arthropathies, not elsewhere classified, right shoulder: Secondary | ICD-10-CM

## 2018-12-27 DIAGNOSIS — M75122 Complete rotator cuff tear or rupture of left shoulder, not specified as traumatic: Secondary | ICD-10-CM

## 2018-12-27 DIAGNOSIS — M75101 Unspecified rotator cuff tear or rupture of right shoulder, not specified as traumatic: Secondary | ICD-10-CM | POA: Diagnosis not present

## 2018-12-27 MED ORDER — NITROGLYCERIN 0.2 MG/HR TD PT24: MEDICATED_PATCH | TRANSDERMAL | 1 refills | Status: DC

## 2018-12-27 NOTE — Assessment & Plan Note (Signed)
Ultrasound full thickness suprapinatus tear with 1.23 cm retraction. Given how well her right rotator cuff healed, suspect that this will heal as well. She still has good strength and ROM mainly due to her activity with Tai Chi.  - start nitroglycerin protocol - rotator cuff rehab exercises- spokes on a wheel, internal and external rotation with theraband - Return in 4 weeks. Will plan to re-scan shoulder in 8 weeks

## 2018-12-27 NOTE — Patient Instructions (Signed)

## 2018-12-27 NOTE — Progress Notes (Signed)
Glenda Reese - 78 y.o. female MRN [id number]  Date of birth: [birth year]  SUBJECTIVE:   CC: left shoulder pain   Glenda Reese comes in today with 5-6 weeks of left shoulder pain. Denies acute injury or inciting event. She reports that originally she had neck pain and left shoulder pain, but neck pain improved after 1-2 weeks. Left shoulder pain persisted, with pain down lateral arm as well. Pain is worsened by overhead motions and she gets pain with external rotation of arm, especially when she has to open a car door. No pain at night.   ROS: No unexpected weight loss, fever, chills, swelling, instability, muscle pain, numbness/tingling, redness, otherwise see HPI   PMHx - Updated and reviewed.  Contributory factors include: Negative PSHx - Updated and reviewed.  Had cervical surgery in 2002. Lumbar surgery x 2. FHx - Updated and reviewed.  Contributory factors include:  Negative Social Hx - Updated and reviewed. Contributory factors include: Negative Medications - reviewed   DATA REVIEWED: Prior records  PHYSICAL EXAM:  VS: BP:108/60  HR: bpm  TEMP: ( )  RESP:   HT:'5\' 3"'$  (160 cm)   WT:112 lb (50.8 kg)  BMI:19.84 PHYSICAL EXAM: Gen: NAD, alert, cooperative with exam, well-appearing HEENT: clear conjunctiva,  CV:  no edema, capillary refill brisk, normal rate Resp: non-labored Skin: no rashes, normal turgor  Neuro: no gross deficits.  Psych:  alert and oriented   Left Shoulder: Inspection reveals no obvious deformity, atrophy, or asymmetry. No bruising. No swelling Palpation is normal with no TTP over Glenda Reese joint or bicipital groove.  Full ROM in flexion, abduction, internal/external rotation  NV intact distally Special Tests:  - Impingement: positive hawkins - Supraspinatous: Negative empty can.  5/5 strength with resisted flexion at 20 degrees, painful - Infraspinatous/Teres Minor: 4/5 strength with ER, pain with resisted ER - Subscapularis: 5/5 strength with IR - Biceps tendon:  Negative Speeds, Yerrgason's  - Labrum: good stability - AC Joint: Negative cross arm -Painful arc, pain with lowering arm from ~150 to 100 degrees  Right shoulder: No deformity No TTP Full ROM Weak with resisted supraspinatus Normal IR, ER  Ultrasound of Right Shoulder Biceps tendon in long view with partial tear Subscapularis retracted and reattached to onset of the footplate. Supraspinatus with fibers attached and multiple calcifications DJD noted over superior humeral head AC joint mild DJD but much improved from prior scan Infrapinatus and teres minor normal Biceps tendon normal  Impression - Healed subscapularis and supraspinatus tears with reattachment of subscapularis at the start of the footplate. Scans have significantly improved since last MRI and [place].  Ultrasound of Left Shoulder, complete ULTRASOUND: Shoulder,   Diagnostic complete ultrasound imaging obtained of patient's left shoulder.  - Bony deformity of superior humeral head with osteophyte development appreciated.  - Long head of the biceps tendon: Calcification with bullseye sign. Fluid in sheath and only a few fibers of biceps tendon.  - Subscapularis tendon: complete visualization across the width of the insertion point yielded partial tear at anterior aspect of foot plate - Supraspinatus tendon: complete visualization across the width of the insertion point yielded full thickness tear with 1.23 cm retraction - Infraspinatus and teres minor tendons: visualization across the width of the insertion points yielded no evidence of tendon thickening, calcification, or tears in the long axis view.  [facility] Joint: Mild osteophyte development appreciated with effusion present.   IMPRESSION: findings consistent with full thickness supraspinatus tear with 1.23 cm retraction    Ultrasound and  interpretation by Dr. Mayer Reese and Glenda Reese. Fields, MD  ASSESSMENT & PLAN:   Rotator cuff tear arthropathy of right shoulder Prior  full thickness tear noted on MRI in July 2015, repeat US in 2018 with smaller new tear. Compared to last [place] earlier in 2020, there is healing of supraspinatus tear and healing of subscapularis tear with reattachment at start of footplate. Clinically, rotator cuff has regained ~80% strength and no longer painful.    Nontraumatic complete tear of left rotator cuff  Ultrasound full thickness suprapinatus tear with 1.23 cm retraction. Given how well her right rotator cuff healed, suspect that this will heal as well. She still has good strength and ROM mainly due to her activity with Tai Chi.  - start nitroglycerin protocol - rotator cuff rehab exercises- spokes on a wheel, internal and external rotation with theraband - Return in 4 weeks. Will plan to re-scan shoulder in 8 weeks  I observed and examined the patient with the Dr. Mayer Reese and agree with assessment and plan.  Note reviewed and modified by me. Glenda Mcgill, MD

## 2018-12-27 NOTE — Assessment & Plan Note (Addendum)
Prior full thickness tear noted on MRI in July 2015, repeat US in 2018 with smaller new tear. Compared to last [place] earlier in 2020, there is healing of supraspinatus tear and healing of subscapularis tear with reattachment at start of footplate. Clinically, rotator cuff has regained ~80% strength and no longer painful.

## 2019-01-31 ENCOUNTER — Other Ambulatory Visit: Payer: Self-pay

## 2019-01-31 ENCOUNTER — Telehealth (INDEPENDENT_AMBULATORY_CARE_PROVIDER_SITE_OTHER): Payer: Medicare HMO | Admitting: Sports Medicine

## 2019-01-31 DIAGNOSIS — M75122 Complete rotator cuff tear or rupture of left shoulder, not specified as traumatic: Secondary | ICD-10-CM

## 2019-01-31 NOTE — Progress Notes (Signed)
Patient request Video followup 2/2 COVID.  However because her video Cam failed to work we did an audio visit on WebEx.  Patient at Home. Doctor in [place] office.  CC; Follow up of left shoulder pain  Patient with bilateral rotator cuff tendinopathy - chronic issues Active and teaches exercise classes Hx of RCT in 2015 and 2018 on RT On 11/5 her visit and [place] suggested Left RC tear - probably full thickness with 1.23 cm retraction of supraspinatus At time pain was significant getting out of car/ putting on coat/ awakening her at night  In past excellent response to  NTG Protocol HEP Caution with her Tai Chi positions  We restarted this [place] for her left shoulder this time  Since last visit:  At the beginning her exercises for the left shoulder Too painful in ER to move much But this has steadily improved Still able to get good ROM even overhead with left arm  Progress is steady and very little pain now At first had trouble getting out of car door but now better Better getting coat on  Night time pain has resolved/ neck pain resolved Now sleeping through night  Stopped NTG 2 weeks ago as some tingling/ burning at patch No HA or rash  ROS  No radicular sxs No numbness Almost no pain in RT shoulder  Exam deferred as an audio visit

## 2019-01-31 NOTE — Assessment & Plan Note (Signed)
On [place] 11/2018 had retracted supraspinatus tear Most sxs worse with ER but infraspinatus looked intact  HEP/ NTG protocol and modified Tai Chi have all helped a lot Steady improvement  Plan cont TX protocol for 6 weeks At that time if COVID not severe we will consider repeat US to see if signs of healing

## 2019-02-25 ENCOUNTER — Other Ambulatory Visit: Payer: Self-pay | Admitting: Sports Medicine

## 2019-03-14 ENCOUNTER — Other Ambulatory Visit: Payer: Medicare HMO | Admitting: Pediatrics

## 2019-03-28 ENCOUNTER — Other Ambulatory Visit: Payer: Medicare HMO | Admitting: Sports Medicine

## 2019-04-04 ENCOUNTER — Other Ambulatory Visit: Payer: Medicare HMO | Admitting: Sports Medicine

## 2019-04-23 ENCOUNTER — Ambulatory Visit: Payer: Self-pay

## 2019-04-23 ENCOUNTER — Other Ambulatory Visit: Payer: Medicare HMO | Admitting: Sports Medicine

## 2019-04-23 ENCOUNTER — Encounter: Payer: Self-pay | Admitting: Sports Medicine

## 2019-04-23 ENCOUNTER — Ambulatory Visit (INDEPENDENT_AMBULATORY_CARE_PROVIDER_SITE_OTHER): Payer: Medicare HMO | Admitting: Sports Medicine

## 2019-04-23 ENCOUNTER — Other Ambulatory Visit: Payer: Self-pay

## 2019-04-23 VITALS — BP 112/66 | Ht 62.5 in | Wt 116.0 lb

## 2019-04-23 DIAGNOSIS — S76212A Strain of adductor muscle, fascia and tendon of left thigh, initial encounter: Secondary | ICD-10-CM

## 2019-04-23 DIAGNOSIS — M25512 Pain in left shoulder: Secondary | ICD-10-CM

## 2019-04-23 NOTE — Progress Notes (Signed)
[facility] [address] [place], [place] [address] Phone: [phone] Fax: [phone]   Patient Name: Glenda Reese Date of Birth: May 15, 1940 Medical Record Number: [id number] Gender: female Date of Encounter: 04/23/2019  SUBJECTIVE:      Chief Complaint:  Left shoulder follow-up and left groin pain   HPI:  Glenda Reese is a 79 year old female following up for left shoulder pain.  She was last seen in person 4 months ago.  She no longer has pain in the rotator cuff.  She does endorse pain and weakness with external rotation.  She is still teaching tai chi once a week.  No radiating symptoms into her fingers. She is also complaining of 2 weeks of left hip pain.  She thought it was IT band related but subsequently feels it more in her groin.  Worse with walking.  It is gotten a lot better taking 3 days off from walking.  No radiating symptoms into her toes. She recently started full body physical therapy through her company and is doing exercises daily.     ROS:     See HPI.   PERTINENT  PMH / PSH / FH / SH:  Past Medical, Surgical, Social, and Family History Reviewed & Updated in the EMR. Pertinent findings include:  Left hammertoe, history of left fibula fracture   OBJECTIVE:  BP 112/66   Ht 5' 2.5" (1.588 m)   Wt 116 lb (52.6 kg)   BMI 20.88 kg/m  Physical Exam:  Vital signs are reviewed.   GEN: Alert and oriented, NAD Pulm: Breathing unlabored PSY: normal mood, congruent affect  MSK: Left shoulder Well developed, well nourished, in no acute distress. No swelling, ecchymoses.  No gross deformity. No TTP. FROM. Strength 5/5 with empty can and resisted internal rotation. Strength 4/5 with external rotation testing Negative Hawkins, Neers. Negative Yergasons. Negative apprehension. Positive speeds NV intact distally.  Left hip: ROM IR: 45 Deg, ER: 45 Deg, Flexion: 120 Deg, Extension: 100 Deg, Abduction: 45 Deg, Adduction: 45  Deg Strength IR: 5/5, ER: 5/5, Flexion: 5/5, Extension: 5/5, Abduction: 4/5, Adduction: 5/5 Pelvic alignment unremarkable to inspection and palpation. Standing hip rotation and gait without trendelenburg sign / unsteadiness. Greater trochanter without tenderness to palpation. No tenderness over piriformis. No pain with FABER or FADIR. No SI joint tenderness and normal minimal SI movement. Pain with resisted adduction  Limited MSK Ultrasound: Left shoulder No evidence of joint effusion.   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 Kathrynn Speed and Wolfgang Phoenix. Fields, MD     ASSESSMENT & PLAN:   1. Left shoulder pain  The pain with external rotation is probably subluxation of the biceps tendon.  Given the split tendon seen on ultrasound today, we will have patient focus on bicep home exercise program.  Continue to use nitro pads as needed.  2.  Left groin strain  Most of her symptoms seem to be consistent with a abductor strain as opposed to hip joint pathology.  We have added groin exercises to her home regimen.   Lanier Clam, DO, ATC Sports Medicine Fellow  I observed and examined the patient with Dr. Kathrynn Speed  and agree with assessment and plan.  Note reviewed and modified by me. Ila Mcgill, MD

## 2019-04-23 NOTE — Patient Instructions (Addendum)
You are doing great!  Do the exercises you were shown at today's visit for your hip pain. We will see you back in 3 months. Call us sooner if things get worse or you aren't continuing to improve.

## 2019-06-18 ENCOUNTER — Ambulatory Visit: Payer: Medicare HMO | Admitting: Sports Medicine

## 2019-07-11 ENCOUNTER — Ambulatory Visit: Payer: Medicare HMO | Admitting: Sports Medicine

## 2019-07-11 ENCOUNTER — Other Ambulatory Visit: Payer: Self-pay

## 2019-07-11 VITALS — BP 108/70 | Ht 63.0 in | Wt 116.0 lb

## 2019-07-11 DIAGNOSIS — M25512 Pain in left shoulder: Secondary | ICD-10-CM | POA: Diagnosis not present

## 2019-07-11 NOTE — Progress Notes (Signed)
Glenda Reese - 79 y.o. female MRN [id number]  Date of birth: [birth year]  SUBJECTIVE:   CC: left shoulder pain  Glenda Reese is a 79 yo female presenting with left chronic shoulder pain. She was last seen on 04/23/2019, had subluxation of biceps tendon with a split biceps tendon visualized on ultrasound. She was given biceps exercises and have been doing those. She had improvement in pain for about a month but then shoulder started to bother her again. She reports that this pain is different- hurts over the top and lateral aspect of her shoulder. Daily activities are bothering her, such as pulling up the covers or reaching to open the car door. She is still doing rotator cuff rehab exercises that have been provided in the past and using nitroglycerin patches daily.  In November 2020- Left RC tear - probably full thickness with 1.23 cm retraction of supraspinatus At time pain was significant getting out of car/ putting on coat/ awakening her at night (similar symptoms)  She is planning on visiting her daughter in several weeks and wants to be functional to help out around the house as she has a new child.  She has cataract surgery next week scheduled for 07/17/19  ROS: swelling, instability, muscle pain, numbness/tingling, redness, otherwise see HPI   PMHx - Updated and reviewed.  Contributory factors include: Negative PSHx - Updated and reviewed.  Contributory factors include:  Negative FHx - Updated and reviewed.  Contributory factors include:  Negative Social Hx - Updated and reviewed. Contributory factors include: Negative Medications - reviewed   DATA REVIEWED: Prior records  PHYSICAL EXAM:  VS: BP:108/70  HR: bpm  TEMP: ( )  RESP:   HT:_0  (160 cm)   WT:116 lb (52.6 kg)  BMI:20.55 PHYSICAL EXAM: Gen: NAD, alert, cooperative with exam, well-appearing HEENT: clear conjunctiva,  CV:  no edema, capillary refill brisk, normal rate Resp: non-labored Skin: no rashes, normal turgor    Neuro: no gross deficits.  Psych:  alert and oriented  Left Shoulder: Inspection reveals no obvious deformity, atrophy, or asymmetry. No bruising. No swelling Palpation is normal with no TTP Full ROM in flexion, abduction, internal/external rotation NV intact distally Normal scapular function observed. Special Tests:  - Impingement: Neg Hawkins, neers, empty can sign. - Supraspinatous: Negative empty can.  5/5 strength with resisted flexion at 20 degrees- some pain - Infraspinatous/Teres Minor: 4/5 strength with ER - Subscapularis: negative belly press, negative bear hug. 5/5 strength with IR - Biceps tendon: Negative Speeds, Yerrgason's  NVI  ULTRASOUND: Shoulder, limited Diagnostic limited ultrasound imaging obtained of patient's left shoulder.  DJD noted over superior humeral head - Long head of the biceps tendon: No evidence of tendon thickening, calcification, subluxation, or tearing in short or long axis views. No edema. - Subscapularis tendon: complete visualization across the width of the insertion point yielded atrophy of muscle with hypoechoic change at anterior aspect of footplate suggesting partial tear - Supraspinatus tendon: complete visualization across the width of the insertion point yielded high grade partial tear at anterior aspect with some calcifications noted as well marked osteophyte development - Infraspinatus and teres minor tendons: visualization across the width of the insertion points yielded no evidence of tendon thickening, calcification, or tears in the long axis view.  [facility] Joint: No evidence of joint separation, collapse, or osteophyte development appreciated. Effusion present.  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  She had significant tear in November 2020 and her ultrasound appears improved from that time but she does have a new hypoechoic area suggesting likely a new tear causing the increased pain she is having.  At this time, she is already doing the appropriate conservative rehab including tai chi, rotator cuff rehab exercises, and nitroglycerin.  She wants to be able to be functional when she goes to visit her daughter in several weeks.  She has an eye surgery coming up next week and with approval from her ophthalmologist, she may return for a corticosteroid injection of her shoulder prior to her trip if she desires.  Patient seen and evaluated with the sports medicine fellow.  I agree with the above plan of care.  I think the patient would benefit from a subacromial cortisone injection but I want her to check with her ophthalmologist first to make sure that a cortisone injection will not interfere with her upcoming cataract surgery next week.  She will follow-up with [place] in the office at her convenience for that injection.

## 2019-07-25 ENCOUNTER — Ambulatory Visit: Payer: Medicare HMO | Admitting: Sports Medicine

## 2019-08-12 ENCOUNTER — Ambulatory Visit (INDEPENDENT_AMBULATORY_CARE_PROVIDER_SITE_OTHER): Payer: Medicare HMO

## 2019-08-12 ENCOUNTER — Other Ambulatory Visit: Payer: Self-pay

## 2019-08-12 DIAGNOSIS — M25512 Pain in left shoulder: Secondary | ICD-10-CM

## 2019-09-17 IMAGING — DX DG TIBIA/FIBULA 2V*L*
4 series · 4 of 4 positions shown · non-contrast
Comparison: No prior images are available for comparison.

CLINICAL DATA: Followup of distal left fibular fracture from
06/18/2017

EXAM:
LEFT TIBIA AND FIBULA - 2 VIEW

[tibia ap (1 of 2)]
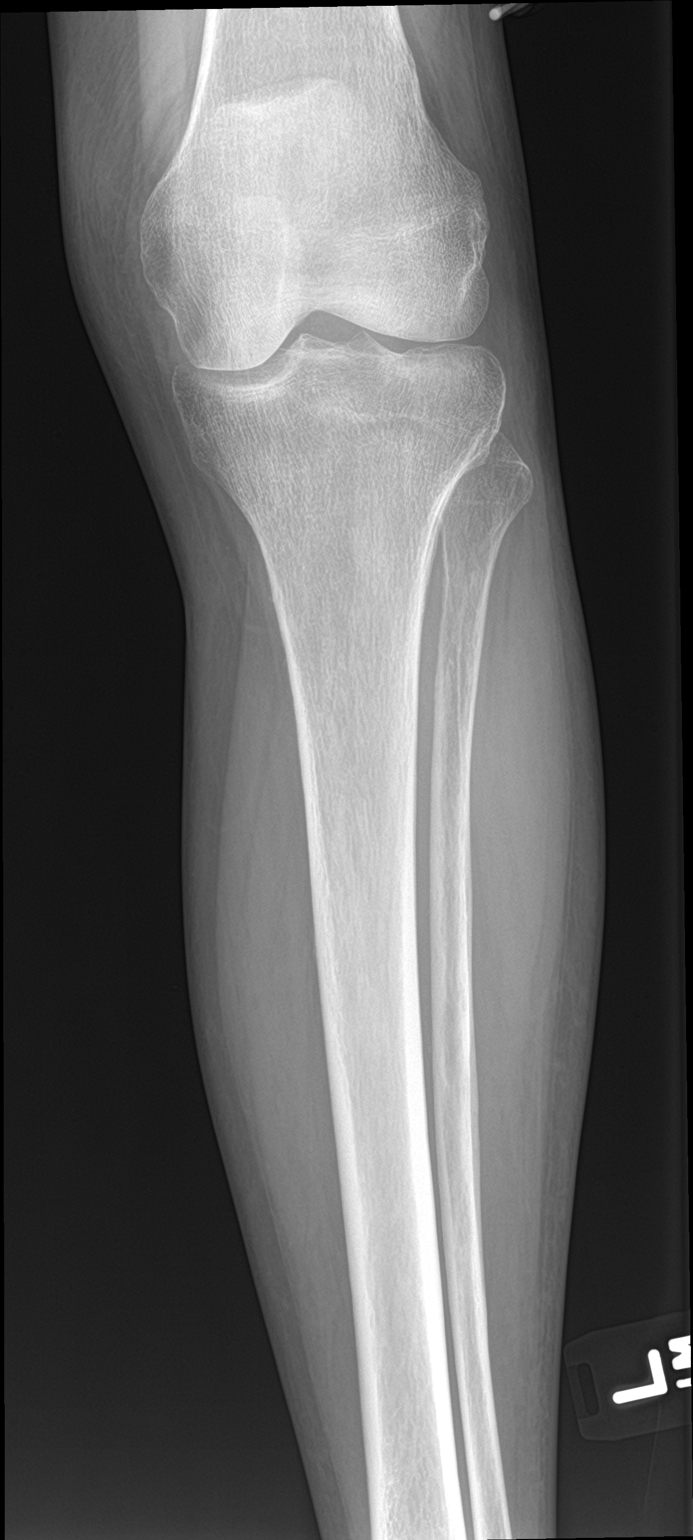

[tibia ap (2 of 2)]
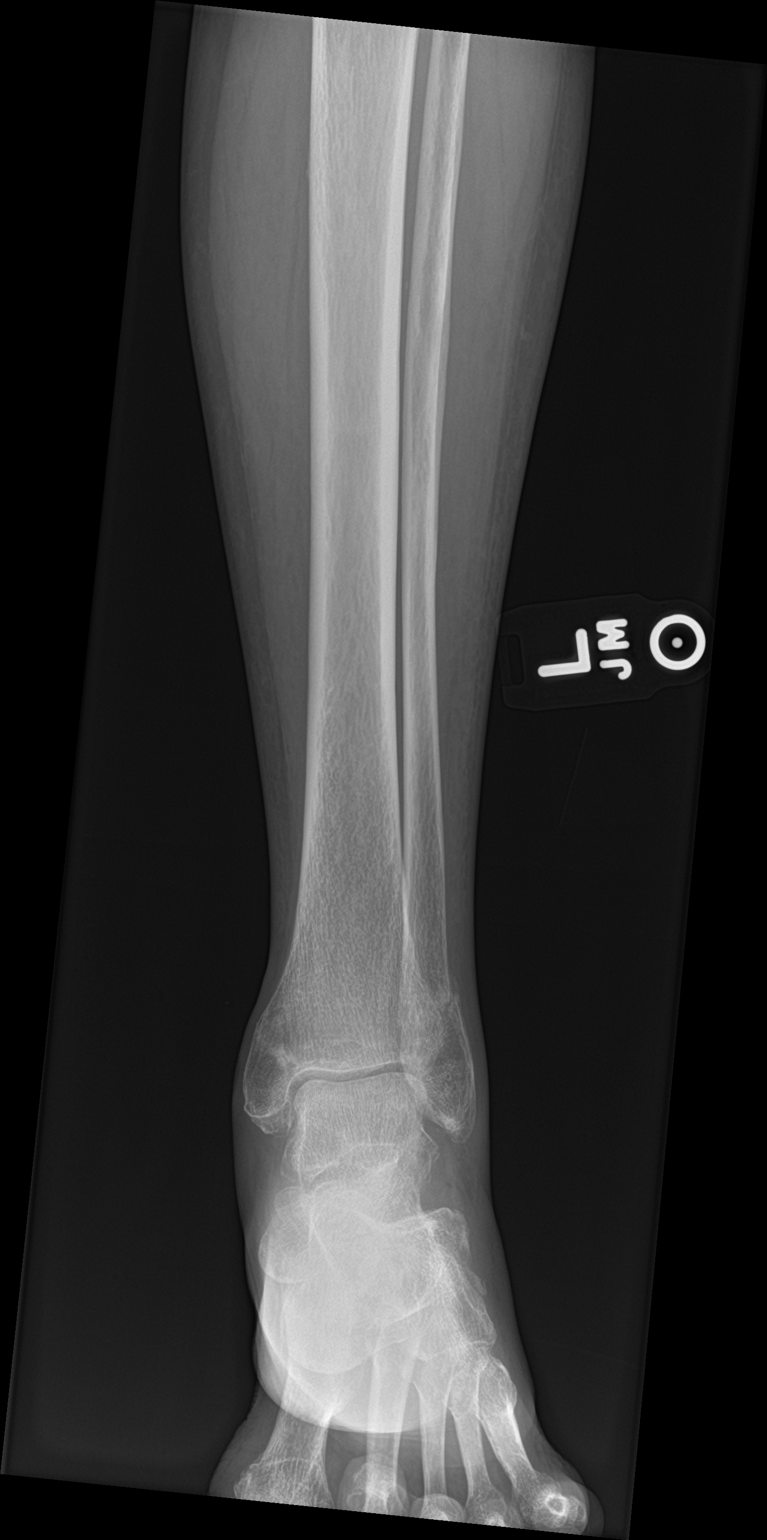

[tibia lat (1 of 2)]
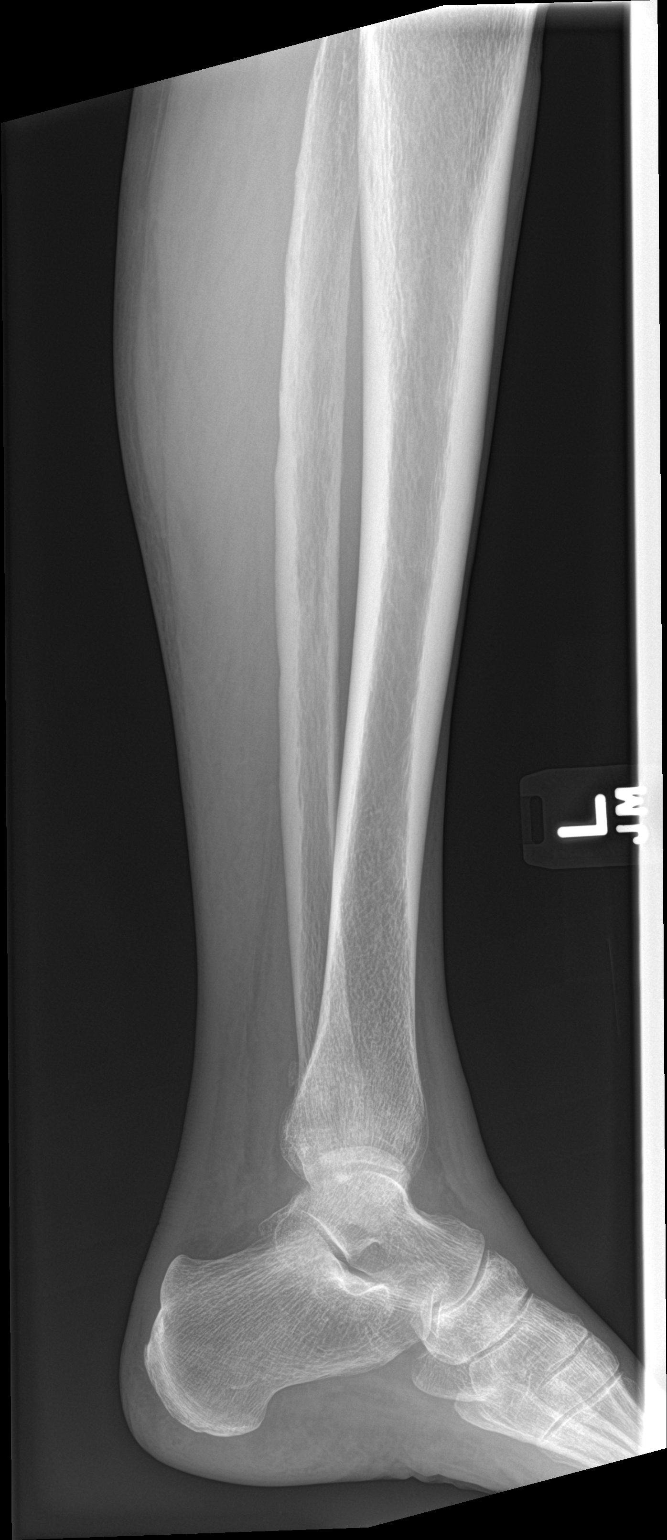

[tibia lat (2 of 2)]
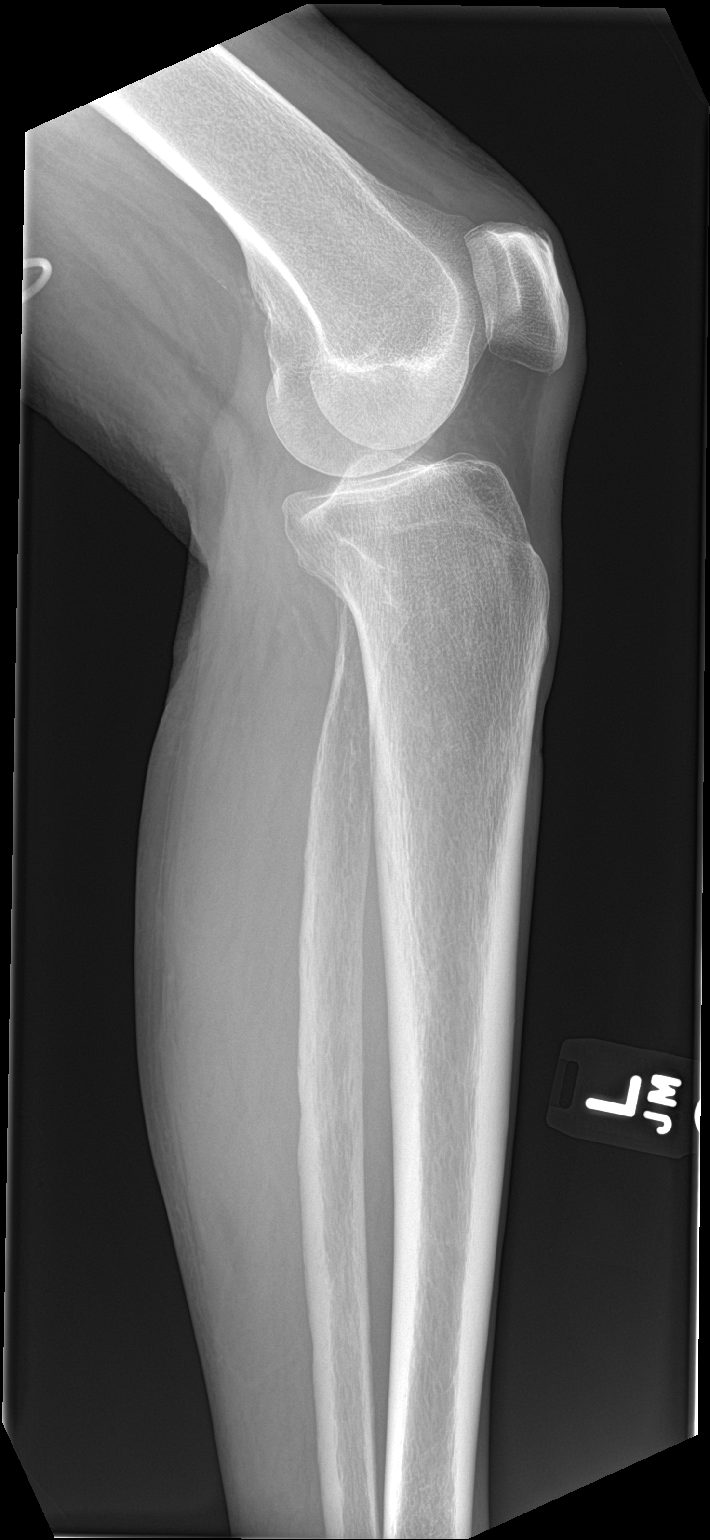

[4 of 4 positions shown; findings below may reference images not displayed]

FINDINGS: There is some callus around the healing fracture of the distal left
fibula without significant displacement. There is a band of
sclerosis across the medial malleolus as well which may indicate
healing nondisplaced fracture. Comparison with the prior images
would be recommended. The remainder of the tibia and fibula are
intact. The ankle joint is unremarkable.
IMPRESSION: 1. Callus around healing fracture of the distal left fibula.
2. Linear sclerosis across the medial malleolus could represent
healing nondisplaced fracture is well. Recommend correlation with
prior images.

## 2019-10-24 ENCOUNTER — Ambulatory Visit: Payer: Medicare HMO | Admitting: Sports Medicine

## 2019-11-07 ENCOUNTER — Ambulatory Visit: Payer: Medicare HMO | Admitting: Sports Medicine

## 2019-11-07 ENCOUNTER — Other Ambulatory Visit: Payer: Self-pay

## 2019-11-07 VITALS — BP 119/48 | Ht 62.5 in | Wt 116.0 lb

## 2019-11-07 DIAGNOSIS — M75101 Unspecified rotator cuff tear or rupture of right shoulder, not specified as traumatic: Secondary | ICD-10-CM | POA: Diagnosis not present

## 2019-11-07 DIAGNOSIS — M75122 Complete rotator cuff tear or rupture of left shoulder, not specified as traumatic: Secondary | ICD-10-CM

## 2019-11-07 DIAGNOSIS — M12811 Other specific arthropathies, not elsewhere classified, right shoulder: Secondary | ICD-10-CM

## 2019-11-07 NOTE — Progress Notes (Signed)
Office Visit Note   Patient: Glenda Reese           Date of Birth: 1940-07-30           MRN: [id number] Visit Date: 11/07/2019 Requested by: Ann Held, MD [address] [place],  [place] [id number] PCP: Ann Held, MD  Subjective: CC: F/U Bilateral shoulder pain  HPI: Patient is a 79 year old female presenting to clinic today to follow-up on bilateral shoulder pain.  She states that she has a long history of struggling with shoulder pain including a full rotator cuff tear on the right and a partial rotator cuff tear in the left.  She was last seen approximately 4 months ago, and and had stated at that time that she was feeling as though she was improving.  She was discharged from care with home exercises and encouraged to continue her daily tai chi.  She is return to clinic today requesting bilateral shoulder ultrasounds as she wants to know if she has made improvement in the appearance of her muscle tears within her rotator cuff bilaterally.  She feels that overall her shoulder pain has drastically improved, and it no longer hold her back from her regular tai chi exercises.  Previously, she was in so much pain in either shoulder that she was barely able to move her arms at all-especially in external rotation.  She continues to feel as though her right shoulder is weak, but does not feel as though this offers significant restrictions on a day-to-day basis.  She denies any new trauma, no significant numbness or tingling in the arms, and otherwise feels as though she is doing very well.              ROS:   All other systems were reviewed and are negative.  Objective: Vital Signs: BP (!) 119/48   Ht 5' 2.5" (1.588 m)   Wt 116 lb (52.6 kg)   BMI 20.88 kg/m   Physical Exam:  General:  Alert and oriented, in no acute distress. Cardiac: Appears well perfused. Pulm:  Breathing unlabored. Psy:  Normal mood, congruent affect. Skin: Bilateral shoulders with no bruising or rashes.   Overlying skin intact. Shoulder exam: Bilateral shoulders with symmetrical muscle mass, no obvious deformity.  No significant tenderness to palpation on either shoulder.  Full range of motion with abduction, flexion, and extension bilaterally.  Does have reduced passive range of motion with internal rotation on the right when compared to the left, external rotation symmetric. Special testing: Empty can with significantly reduced strength on the right when compared to the left, however no pain. Somewhat reduced strength on the right with belly press and lift off, O'Brien's with no significant pain or weakness. Does endorse some discomfort with active AC compression bilaterally.  Brisk distal capillary refill, and sensation intact.   Imaging: Upper extremity ultrasound- Right shoulder: Biceps tendon visualized in long and short axis with no thickening or surrounding fluid. Pectoralis tendon intact, similarly with no thickening or surrounding fluid. Supraspinatus with full tear and retraction to the level of the acromion.  Infraspinatus, subscapularis, teres minor visualized with fibers intact, no surrounding fluid or significant swelling. Glenohumeral joint with mild degenerative changes, no significant effusion. Posterior labrum visualized intact with no surrounding fluid. AC joint with mild degenerative changes, however no significant effusion.  Impression: Full tear of supraspinatus with retraction to the level of the acromion.  Upper extremity ultrasound- Left shoulder:  Biceps tendon visualized in long and short  axis with no thickening or surrounding fluid.  Pectoralis tendon also intact with no evidence of inflammation or surrounding fluid. Supraspinatus with calcification at distal insertion point, consistent with previous examination approximately 4 months ago.  Significant improvement of previously visualized tear, fibers now appearing intact.  Infraspinatus, subscapularis, teres  minor visualized with no significant tears, thickening, or surrounding fluid. Glenohumeral joint with minimal degenerative changes, no surrounding effusion. Posterior labrum appears intact. AC joint with mild degenerative changes, minimal surrounding effusion.  Impression: Resolution of previously appreciated partial supraspinatus tear.   Ultrasound and interpretation by Dr. Elouise Munroe and Wolfgang Phoenix. Fields, MD   Assessment & Plan: 79 year old female presenting to clinic today to follow-up on bilateral shoulder rotator cuff injuries.  Patient states she is essentially symptom-free today, though does remain somewhat weaker in the right shoulder-however this does not impact her on a day-to-day basis. Examination and ultrasound as above, with significant improvement in previously appreciated partial left supraspinatus tear.  Remains with full right supraspinatus tear, with retraction to the level of the acromion. -Encouraged to continue with her daily tai chi exercises, as this is likely crucial in her rehabilitation and maintaining her shoulder range of motion and strength. -Encouraged to continue internal and external rotation exercises as previously prescribed. -Given her significant symptom improvement, no further follow-up is needed unless her symptoms worsen. -Patient is agreeable with plan, and has no further questions or concerns today.     Procedures: No procedures performed  No notes on file   I observed and examined the patient with Dr. Edd Arbour and agree with assessment and plan.  Note reviewed and modified by me. Ila Mcgill, MD

## 2020-05-26 ENCOUNTER — Ambulatory Visit: Payer: Medicare Other | Admitting: Sports Medicine

## 2020-05-26 ENCOUNTER — Other Ambulatory Visit: Payer: Self-pay

## 2020-05-26 VITALS — BP 118/74 | Ht 62.0 in | Wt 118.0 lb

## 2020-05-26 DIAGNOSIS — M25375 Other instability, left foot: Secondary | ICD-10-CM

## 2020-05-26 NOTE — Patient Instructions (Signed)
Foot neuropathy:  It is possible that there is inflammation from your fracture that is irritating a nerve that runs to your foot.  Apply Voltaren gel 4 times daily to the area we discussed in clinic, this will help decrease the inflammation.  Use the compression sleeve we gave you during exercises.  It will also be helpful to perform the therapy exercises we discussed daily to help strengthen the muscles that have become weak.  Lets see you back in clinic in 4-6 weeks to see how these things have helped.

## 2020-05-26 NOTE — Progress Notes (Addendum)
   PCP: Ann Held, MD  Subjective:   HPI: Patient is a 80 y.o. 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  Her previous medical history is notable for scoliosis status post spinal surgery and a previous left fibular fracture.  Left superficial peroneal neuropathy Ms. Rick Duff reports that she suddenly started having difficulty with her left foot this past January.  She notes that she has been having changes in the sensation of the outside of her left foot and feels that her left ankle is much more unstable than it has been previously.  Prior to this year, her left ankle was her "good" ankle which is no longer the case.  She feels unsteady when she takes a step with her left foot.  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  Review of Systems:  Per HPI.   Cesar Chavez, medications and smoking status reviewed.      Objective:  Physical Exam:  [facility] Adult Exercise 11/07/2019  Frequency of aerobic exercise (# of days/week) 5  Average time in minutes 60  Frequency of strengthening activities (# of days/week) 3   BP 118/74   Ht _0  (1.575 m)   Wt 118 lb (53.5 kg)   BMI 21.58 kg/m    Gen: awake, alert, NAD, comfortable in exam room Pulm: breathing unlabored  Foot: Inspection:  No obvious bony deformity.  No swelling, erythema, or bruising.  Normal arch Palpation: No tenderness to palpation ROM: Full  ROM of the ankle. Normal midfoot flexibility Neurovascular: N/V intact distally in the lower extremity Special tests: Some discomfort and numbness experienced with Tinel test of the superficial peroneal nerve. Gait: Notable instability and lack of eversion of the right foot during her normal gait.  Ultrasound: Evidence of callus formation along the distal fibula.  Within 0.5-1.0 cm of the superficial peroneal nerve.    Assessment & Plan:  1.  Left ankle instability Difficult presentation.  The differential includes neuropathy of the  superficial peroneal nerve, neuropathy related to her known scoliosis and back surgery.  I have a low suspicion for vascular etiology as she seems to have good pulses and appropriate blood flow.  Her regular exercise regimen indicates that she generally is well perfused additionally, her symptoms do not sound typical for a vascular pathology.  She does clearly have some weakness and instability of the lateral foot.  I am suspicious that this is related to a neuropathy of the superficial peroneal nerve.  This is potentially related to the callus formation along the distal fibula close to the path of the superficial peroneal nerve.  This would explain her neuropathy and lateral foot weakness.  We will move forward with treatment for this issue and see how she does.  For now, we will have her use a compression sleeve in addition to applying Voltaren gel 4 times daily.  She was also given foot strengthening exercises to perform daily to help combat against further weakness.  Follow-up in 4-6 weeks to assess her progress.  Matilde Haymaker, MD PGY-3 05/26/2020 2:04 PM   I observed and examined the patient with the resident and agree with assessment and plan.  Note reviewed and modified by me. IN addition after we placed the ankle compression sleeve her walking gait was more stable and felt more comfortable. Ila Mcgill, MD

## 2020-06-25 ENCOUNTER — Ambulatory Visit: Payer: Medicare Other | Admitting: Sports Medicine

## 2020-07-16 ENCOUNTER — Ambulatory Visit: Payer: Medicare Other | Admitting: Sports Medicine

## 2020-08-04 ENCOUNTER — Ambulatory Visit: Payer: Medicare Other | Admitting: Sports Medicine

## 2021-05-20 ENCOUNTER — Ambulatory Visit: Payer: Medicare Other | Admitting: Sports Medicine

## 2021-06-07 DIAGNOSIS — J0100 Acute maxillary sinusitis, unspecified: Secondary | ICD-10-CM | POA: Diagnosis not present

## 2021-06-08 DIAGNOSIS — Z0000 Encounter for general adult medical examination without abnormal findings: Secondary | ICD-10-CM | POA: Diagnosis not present

## 2021-06-08 DIAGNOSIS — L2084 Intrinsic (allergic) eczema: Secondary | ICD-10-CM | POA: Diagnosis not present

## 2021-06-08 DIAGNOSIS — L814 Other melanin hyperpigmentation: Secondary | ICD-10-CM | POA: Diagnosis not present

## 2021-06-08 DIAGNOSIS — D692 Other nonthrombocytopenic purpura: Secondary | ICD-10-CM | POA: Diagnosis not present

## 2021-06-10 DIAGNOSIS — Z961 Presence of intraocular lens: Secondary | ICD-10-CM | POA: Diagnosis not present

## 2021-06-10 DIAGNOSIS — H353111 Nonexudative age-related macular degeneration, right eye, early dry stage: Secondary | ICD-10-CM | POA: Diagnosis not present

## 2021-06-10 DIAGNOSIS — H43813 Vitreous degeneration, bilateral: Secondary | ICD-10-CM | POA: Diagnosis not present

## 2021-06-10 DIAGNOSIS — H26491 Other secondary cataract, right eye: Secondary | ICD-10-CM | POA: Diagnosis not present

## 2021-06-22 DIAGNOSIS — R350 Frequency of micturition: Secondary | ICD-10-CM | POA: Diagnosis not present

## 2021-06-23 DIAGNOSIS — T1511XA Foreign body in conjunctival sac, right eye, initial encounter: Secondary | ICD-10-CM | POA: Diagnosis not present

## 2021-08-23 DIAGNOSIS — J309 Allergic rhinitis, unspecified: Secondary | ICD-10-CM | POA: Diagnosis not present

## 2021-08-23 DIAGNOSIS — J029 Acute pharyngitis, unspecified: Secondary | ICD-10-CM | POA: Diagnosis not present

## 2021-09-01 DIAGNOSIS — R002 Palpitations: Secondary | ICD-10-CM | POA: Diagnosis not present

## 2021-09-01 DIAGNOSIS — I491 Atrial premature depolarization: Secondary | ICD-10-CM | POA: Diagnosis not present

## 2021-09-01 DIAGNOSIS — R946 Abnormal results of thyroid function studies: Secondary | ICD-10-CM | POA: Diagnosis not present

## 2021-09-05 DIAGNOSIS — I491 Atrial premature depolarization: Secondary | ICD-10-CM | POA: Diagnosis not present

## 2021-09-10 DIAGNOSIS — R002 Palpitations: Secondary | ICD-10-CM | POA: Diagnosis not present

## 2021-09-10 DIAGNOSIS — H6122 Impacted cerumen, left ear: Secondary | ICD-10-CM | POA: Diagnosis not present

## 2021-09-21 ENCOUNTER — Ambulatory Visit: Payer: Medicare Other | Admitting: Sports Medicine

## 2021-10-07 ENCOUNTER — Ambulatory Visit: Payer: Medicare Other | Admitting: Sports Medicine

## 2021-10-11 DIAGNOSIS — L03316 Cellulitis of umbilicus: Secondary | ICD-10-CM | POA: Diagnosis not present

## 2021-10-11 DIAGNOSIS — Z6820 Body mass index (BMI) 20.0-20.9, adult: Secondary | ICD-10-CM | POA: Diagnosis not present

## 2021-10-14 DIAGNOSIS — L0882 Omphalitis not of newborn: Secondary | ICD-10-CM | POA: Diagnosis not present

## 2021-10-14 DIAGNOSIS — Z20822 Contact with and (suspected) exposure to covid-19: Secondary | ICD-10-CM | POA: Diagnosis not present

## 2021-10-16 DIAGNOSIS — R238 Other skin changes: Secondary | ICD-10-CM | POA: Diagnosis not present

## 2021-10-26 DIAGNOSIS — R300 Dysuria: Secondary | ICD-10-CM | POA: Diagnosis not present

## 2021-10-28 ENCOUNTER — Ambulatory Visit: Payer: Medicare Other | Admitting: Sports Medicine

## 2021-10-28 DIAGNOSIS — N368 Other specified disorders of urethra: Secondary | ICD-10-CM | POA: Diagnosis not present

## 2021-10-28 DIAGNOSIS — N952 Postmenopausal atrophic vaginitis: Secondary | ICD-10-CM | POA: Diagnosis not present

## 2021-10-28 DIAGNOSIS — R350 Frequency of micturition: Secondary | ICD-10-CM | POA: Diagnosis not present

## 2021-11-02 ENCOUNTER — Ambulatory Visit: Payer: Self-pay

## 2021-11-02 ENCOUNTER — Ambulatory Visit: Payer: Medicare Other | Admitting: Sports Medicine

## 2021-11-02 VITALS — BP 126/62

## 2021-11-02 DIAGNOSIS — M25511 Pain in right shoulder: Secondary | ICD-10-CM

## 2021-11-02 DIAGNOSIS — S46801A Unspecified injury of other muscles, fascia and tendons at shoulder and upper arm level, right arm, initial encounter: Secondary | ICD-10-CM | POA: Insufficient documentation

## 2021-11-02 NOTE — Progress Notes (Signed)
 Established Patient Office Visit  Subjective   Patient ID: Glenda Reese, female    DOB: 11/17/1940  Age: 81 y.o. MRN: [id number]  Right shoulder pain.  Glenda Reese presents today with chief complaint of right shoulder pain that began bothering her in May after playing pickle ball.  She has had injuries to the shoulder in the past that have felt similar.  Her pain comes and goes.  She has made multiple appointments for this but it improves prior to the appointment.  Her pain today is minimal however she would like to have her shoulder evaluated.  She is able to do her activities of daily living.  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 She denies pain that wakes her from sleep, numbness and tingling or radiation of pain.  ROS as listed above in HPI    Objective:     BP 126/62   Physical Exam Vitals reviewed.  Constitutional:      General: She is not in acute distress.    Appearance: Normal appearance. She is normal weight. She is not ill-appearing, toxic-appearing or diaphoretic.  Pulmonary:     Effort: Pulmonary effort is normal.  Neurological:     Mental Status: She is alert.   Right shoulder: No obvious deformity or asymmetry.  No ecchymosis or edema.  No tenderness to palpation of the AC joint or posterior shoulder.  Full range of motion forward flexion, abduction, internal and external rotation.  Strength 5/5 for flexion, abduction internal and external rotation.  Some decreased strength with abduction and internal rotation.  Grip strength 5/5 bilaterally.  Distal pulses, radial 2+ bilaterally.  Negative empty can, negative Hawkins negative Neer's.  [facility] Complete US of Shoulder, right  Patient was seated on exam table and shoulder US examination was performed using high frequency linear probe.   Biceps tendon- visualized within the bicipital groove in both  longitudinal and transverse axis. There is an area of hypoechoic change seen in both long and short axis. Tendon fibers intact with signs or partial tear. Pectoralis tendon-visualized in longitudinal axis with no evidence of hypoechoic changes. Subscapularis tendon- visualized in both longitudinal and transverse axis with retracted tendon and seroma at insertion, inserting proximal to the inferior lesser tubercle of the humerus.  Supraspinatus tendon- visualized in longitudinal, transverse, and dynamic views. No signs of tear with the tendon inserting at the superior facet of the greater tubercle of the humerus, no hypoechoic changes. Hyperechoic calcification in supraspinatus tendon. Infraspinatus and teres minor tendons- visualized in both longitudinal and transverse axis with tendon insertion at the middle facet of the great tubercle of the humerus with no signs of tearing, hypoechoic changes or tissue irregularity seen.  Posterior glenohumeral joint- visualized with proper alignment.  AC joint- visualized without bursal distension or significant bony spurs/arthritic changes.   IMPRESSION: Subscapularis tear with retraction and seroma present, calcific changes visualized within supraspinatus tendon on complete U/S examination of the shoulder.   Ultrasound and interpretation byDr. Tedrowe and Karl B. Fields, MD     Assessment & Plan:   Problem List Items Addressed This Visit       Musculoskeletal and Integument   Traumatic injury of right subscapularis tendon - Primary    Tear subscapularis with retraction ultrasound.  Patient is likely compensated with pectoralis muscles.  Patient was given.  Strengthening exercises for the shoulder she may continue with her   activities of daily living and exercise classes.  Follow-up if no improvement over 4 to 6 weeks.      Relevant Orders   US LIMITED JOINT SPACE STRUCTURES UP RIGHT    Return in about 6 weeks (around 12/14/2021).    Michelle A  Tedrowe, DO  I observed and examined the patient with the resident and agree with assessment and plan.  Note reviewed and modified by me. KB Fields, MD  

## 2021-11-02 NOTE — Assessment & Plan Note (Signed)
Tear subscapularis with retraction ultrasound.  Patient is likely compensated with pectoralis muscles.  Patient was given.  Strengthening exercises for the shoulder she may continue with her activities of daily living and exercise classes.  Follow-up if no improvement over 4 to 6 weeks.

## 2021-11-03 DIAGNOSIS — R002 Palpitations: Secondary | ICD-10-CM | POA: Diagnosis not present

## 2021-11-09 DIAGNOSIS — H5212 Myopia, left eye: Secondary | ICD-10-CM | POA: Diagnosis not present

## 2021-11-16 DIAGNOSIS — Z780 Asymptomatic menopausal state: Secondary | ICD-10-CM | POA: Diagnosis not present

## 2021-12-02 DIAGNOSIS — R002 Palpitations: Secondary | ICD-10-CM | POA: Diagnosis not present

## 2021-12-03 DIAGNOSIS — R000 Tachycardia, unspecified: Secondary | ICD-10-CM | POA: Diagnosis not present

## 2021-12-03 DIAGNOSIS — R9431 Abnormal electrocardiogram [ECG] [EKG]: Secondary | ICD-10-CM | POA: Diagnosis not present

## 2021-12-03 DIAGNOSIS — R002 Palpitations: Secondary | ICD-10-CM | POA: Diagnosis not present

## 2021-12-14 DIAGNOSIS — R9431 Abnormal electrocardiogram [ECG] [EKG]: Secondary | ICD-10-CM | POA: Diagnosis not present

## 2021-12-14 DIAGNOSIS — R002 Palpitations: Secondary | ICD-10-CM | POA: Diagnosis not present

## 2021-12-14 DIAGNOSIS — L814 Other melanin hyperpigmentation: Secondary | ICD-10-CM | POA: Diagnosis not present

## 2021-12-14 DIAGNOSIS — C44622 Squamous cell carcinoma of skin of right upper limb, including shoulder: Secondary | ICD-10-CM | POA: Diagnosis not present

## 2021-12-14 DIAGNOSIS — R000 Tachycardia, unspecified: Secondary | ICD-10-CM | POA: Diagnosis not present

## 2021-12-14 DIAGNOSIS — L738 Other specified follicular disorders: Secondary | ICD-10-CM | POA: Diagnosis not present

## 2021-12-14 DIAGNOSIS — L2089 Other atopic dermatitis: Secondary | ICD-10-CM | POA: Diagnosis not present

## 2021-12-14 DIAGNOSIS — D225 Melanocytic nevi of trunk: Secondary | ICD-10-CM | POA: Diagnosis not present

## 2021-12-14 DIAGNOSIS — D485 Neoplasm of uncertain behavior of skin: Secondary | ICD-10-CM | POA: Diagnosis not present

## 2021-12-16 DIAGNOSIS — H903 Sensorineural hearing loss, bilateral: Secondary | ICD-10-CM | POA: Diagnosis not present

## 2021-12-17 DIAGNOSIS — J320 Chronic maxillary sinusitis: Secondary | ICD-10-CM | POA: Diagnosis not present

## 2021-12-17 DIAGNOSIS — J339 Nasal polyp, unspecified: Secondary | ICD-10-CM | POA: Diagnosis not present

## 2021-12-17 DIAGNOSIS — J310 Chronic rhinitis: Secondary | ICD-10-CM | POA: Diagnosis not present

## 2021-12-21 DIAGNOSIS — J4520 Mild intermittent asthma, uncomplicated: Secondary | ICD-10-CM | POA: Diagnosis not present

## 2021-12-21 DIAGNOSIS — G2581 Restless legs syndrome: Secondary | ICD-10-CM | POA: Diagnosis not present

## 2021-12-21 DIAGNOSIS — J309 Allergic rhinitis, unspecified: Secondary | ICD-10-CM | POA: Diagnosis not present

## 2021-12-21 DIAGNOSIS — Z8619 Personal history of other infectious and parasitic diseases: Secondary | ICD-10-CM | POA: Diagnosis not present

## 2022-01-17 DIAGNOSIS — C44622 Squamous cell carcinoma of skin of right upper limb, including shoulder: Secondary | ICD-10-CM | POA: Diagnosis not present

## 2022-01-17 DIAGNOSIS — L905 Scar conditions and fibrosis of skin: Secondary | ICD-10-CM | POA: Diagnosis not present

## 2022-02-01 DIAGNOSIS — H9221 Otorrhagia, right ear: Secondary | ICD-10-CM | POA: Diagnosis not present

## 2022-02-01 DIAGNOSIS — Z883 Allergy status to other anti-infective agents status: Secondary | ICD-10-CM | POA: Diagnosis not present

## 2022-02-01 DIAGNOSIS — Z974 Presence of external hearing-aid: Secondary | ICD-10-CM | POA: Diagnosis not present

## 2022-02-01 DIAGNOSIS — Z881 Allergy status to other antibiotic agents status: Secondary | ICD-10-CM | POA: Diagnosis not present

## 2022-02-01 DIAGNOSIS — Z885 Allergy status to narcotic agent status: Secondary | ICD-10-CM | POA: Diagnosis not present

## 2022-02-01 DIAGNOSIS — Z882 Allergy status to sulfonamides status: Secondary | ICD-10-CM | POA: Diagnosis not present

## 2022-02-01 DIAGNOSIS — Z888 Allergy status to other drugs, medicaments and biological substances status: Secondary | ICD-10-CM | POA: Diagnosis not present

## 2022-03-04 DIAGNOSIS — M5187 Other intervertebral disc disorders, lumbosacral region: Secondary | ICD-10-CM | POA: Diagnosis not present

## 2022-03-08 DIAGNOSIS — R3915 Urgency of urination: Secondary | ICD-10-CM | POA: Diagnosis not present

## 2022-03-08 DIAGNOSIS — N958 Other specified menopausal and perimenopausal disorders: Secondary | ICD-10-CM | POA: Diagnosis not present

## 2022-03-08 DIAGNOSIS — N368 Other specified disorders of urethra: Secondary | ICD-10-CM | POA: Diagnosis not present

## 2022-03-08 DIAGNOSIS — R002 Palpitations: Secondary | ICD-10-CM | POA: Diagnosis not present

## 2022-03-10 DIAGNOSIS — M5187 Other intervertebral disc disorders, lumbosacral region: Secondary | ICD-10-CM | POA: Diagnosis not present

## 2022-03-14 DIAGNOSIS — M5187 Other intervertebral disc disorders, lumbosacral region: Secondary | ICD-10-CM | POA: Diagnosis not present

## 2022-03-15 DIAGNOSIS — J00 Acute nasopharyngitis [common cold]: Secondary | ICD-10-CM | POA: Diagnosis not present

## 2022-03-15 DIAGNOSIS — H5213 Myopia, bilateral: Secondary | ICD-10-CM | POA: Diagnosis not present

## 2022-03-21 DIAGNOSIS — M5187 Other intervertebral disc disorders, lumbosacral region: Secondary | ICD-10-CM | POA: Diagnosis not present

## 2022-03-28 DIAGNOSIS — M5187 Other intervertebral disc disorders, lumbosacral region: Secondary | ICD-10-CM | POA: Diagnosis not present

## 2022-03-29 DIAGNOSIS — J322 Chronic ethmoidal sinusitis: Secondary | ICD-10-CM | POA: Diagnosis not present

## 2022-03-29 DIAGNOSIS — J3489 Other specified disorders of nose and nasal sinuses: Secondary | ICD-10-CM | POA: Diagnosis not present

## 2022-03-30 DIAGNOSIS — R7303 Prediabetes: Secondary | ICD-10-CM | POA: Diagnosis not present

## 2022-03-30 DIAGNOSIS — Z0000 Encounter for general adult medical examination without abnormal findings: Secondary | ICD-10-CM | POA: Diagnosis not present

## 2022-03-30 DIAGNOSIS — E7800 Pure hypercholesterolemia, unspecified: Secondary | ICD-10-CM | POA: Diagnosis not present

## 2022-04-01 DIAGNOSIS — Z0000 Encounter for general adult medical examination without abnormal findings: Secondary | ICD-10-CM | POA: Diagnosis not present

## 2022-04-01 DIAGNOSIS — Z1330 Encounter for screening examination for mental health and behavioral disorders, unspecified: Secondary | ICD-10-CM | POA: Diagnosis not present

## 2022-04-12 DIAGNOSIS — M5187 Other intervertebral disc disorders, lumbosacral region: Secondary | ICD-10-CM | POA: Diagnosis not present

## 2022-04-14 DIAGNOSIS — K9289 Other specified diseases of the digestive system: Secondary | ICD-10-CM | POA: Diagnosis not present

## 2022-04-14 DIAGNOSIS — Z86010 Personal history of colonic polyps: Secondary | ICD-10-CM | POA: Diagnosis not present

## 2022-04-14 DIAGNOSIS — Z83719 Family history of colon polyps, unspecified: Secondary | ICD-10-CM | POA: Diagnosis not present

## 2022-04-14 DIAGNOSIS — K588 Other irritable bowel syndrome: Secondary | ICD-10-CM | POA: Diagnosis not present

## 2022-04-28 DIAGNOSIS — M5187 Other intervertebral disc disorders, lumbosacral region: Secondary | ICD-10-CM | POA: Diagnosis not present

## 2022-04-30 DIAGNOSIS — R109 Unspecified abdominal pain: Secondary | ICD-10-CM | POA: Diagnosis not present

## 2022-05-09 DIAGNOSIS — R0789 Other chest pain: Secondary | ICD-10-CM | POA: Diagnosis not present

## 2022-05-10 DIAGNOSIS — N952 Postmenopausal atrophic vaginitis: Secondary | ICD-10-CM | POA: Diagnosis not present

## 2022-05-10 DIAGNOSIS — N368 Other specified disorders of urethra: Secondary | ICD-10-CM | POA: Diagnosis not present

## 2022-05-12 DIAGNOSIS — M5187 Other intervertebral disc disorders, lumbosacral region: Secondary | ICD-10-CM | POA: Diagnosis not present

## 2022-05-26 DIAGNOSIS — N958 Other specified menopausal and perimenopausal disorders: Secondary | ICD-10-CM | POA: Diagnosis not present

## 2022-05-30 DIAGNOSIS — J310 Chronic rhinitis: Secondary | ICD-10-CM | POA: Diagnosis not present

## 2022-06-01 DIAGNOSIS — Z1231 Encounter for screening mammogram for malignant neoplasm of breast: Secondary | ICD-10-CM | POA: Diagnosis not present

## 2022-06-01 DIAGNOSIS — R92323 Mammographic fibroglandular density, bilateral breasts: Secondary | ICD-10-CM | POA: Diagnosis not present

## 2022-06-09 DIAGNOSIS — T781XXA Other adverse food reactions, not elsewhere classified, initial encounter: Secondary | ICD-10-CM | POA: Diagnosis not present

## 2022-06-09 DIAGNOSIS — Z713 Dietary counseling and surveillance: Secondary | ICD-10-CM | POA: Diagnosis not present

## 2022-06-10 DIAGNOSIS — J301 Allergic rhinitis due to pollen: Secondary | ICD-10-CM | POA: Diagnosis not present

## 2022-07-13 DIAGNOSIS — K580 Irritable bowel syndrome with diarrhea: Secondary | ICD-10-CM | POA: Diagnosis not present

## 2022-07-21 DIAGNOSIS — T781XXA Other adverse food reactions, not elsewhere classified, initial encounter: Secondary | ICD-10-CM | POA: Diagnosis not present

## 2022-08-16 DIAGNOSIS — L089 Local infection of the skin and subcutaneous tissue, unspecified: Secondary | ICD-10-CM | POA: Diagnosis not present

## 2022-08-26 DIAGNOSIS — J029 Acute pharyngitis, unspecified: Secondary | ICD-10-CM | POA: Diagnosis not present

## 2022-08-26 DIAGNOSIS — H6691 Otitis media, unspecified, right ear: Secondary | ICD-10-CM | POA: Diagnosis not present

## 2022-09-02 DIAGNOSIS — J0100 Acute maxillary sinusitis, unspecified: Secondary | ICD-10-CM | POA: Diagnosis not present

## 2022-09-02 DIAGNOSIS — J4540 Moderate persistent asthma, uncomplicated: Secondary | ICD-10-CM | POA: Diagnosis not present

## 2022-09-18 DIAGNOSIS — B370 Candidal stomatitis: Secondary | ICD-10-CM | POA: Diagnosis not present

## 2022-09-29 DIAGNOSIS — R32 Unspecified urinary incontinence: Secondary | ICD-10-CM | POA: Diagnosis not present

## 2022-10-13 DIAGNOSIS — B370 Candidal stomatitis: Secondary | ICD-10-CM | POA: Diagnosis not present

## 2022-11-01 DIAGNOSIS — R0981 Nasal congestion: Secondary | ICD-10-CM | POA: Diagnosis not present

## 2022-11-06 DIAGNOSIS — R0989 Other specified symptoms and signs involving the circulatory and respiratory systems: Secondary | ICD-10-CM | POA: Diagnosis not present

## 2022-11-06 DIAGNOSIS — J019 Acute sinusitis, unspecified: Secondary | ICD-10-CM | POA: Diagnosis not present

## 2022-11-10 DIAGNOSIS — D803 Selective deficiency of immunoglobulin G [IgG] subclasses: Secondary | ICD-10-CM | POA: Diagnosis not present

## 2022-11-10 DIAGNOSIS — R059 Cough, unspecified: Secondary | ICD-10-CM | POA: Diagnosis not present

## 2022-11-12 DIAGNOSIS — J4540 Moderate persistent asthma, uncomplicated: Secondary | ICD-10-CM | POA: Diagnosis not present

## 2022-12-22 DIAGNOSIS — L821 Other seborrheic keratosis: Secondary | ICD-10-CM | POA: Diagnosis not present

## 2022-12-22 DIAGNOSIS — L814 Other melanin hyperpigmentation: Secondary | ICD-10-CM | POA: Diagnosis not present

## 2022-12-22 DIAGNOSIS — D225 Melanocytic nevi of trunk: Secondary | ICD-10-CM | POA: Diagnosis not present

## 2022-12-22 DIAGNOSIS — L570 Actinic keratosis: Secondary | ICD-10-CM | POA: Diagnosis not present

## 2022-12-22 DIAGNOSIS — L2089 Other atopic dermatitis: Secondary | ICD-10-CM | POA: Diagnosis not present

## 2022-12-24 DIAGNOSIS — J4540 Moderate persistent asthma, uncomplicated: Secondary | ICD-10-CM | POA: Diagnosis not present

## 2022-12-24 DIAGNOSIS — H6993 Unspecified Eustachian tube disorder, bilateral: Secondary | ICD-10-CM | POA: Diagnosis not present

## 2022-12-24 DIAGNOSIS — J310 Chronic rhinitis: Secondary | ICD-10-CM | POA: Diagnosis not present

## 2022-12-27 DIAGNOSIS — R053 Chronic cough: Secondary | ICD-10-CM | POA: Diagnosis not present

## 2022-12-27 DIAGNOSIS — J329 Chronic sinusitis, unspecified: Secondary | ICD-10-CM | POA: Diagnosis not present

## 2022-12-29 DIAGNOSIS — R159 Full incontinence of feces: Secondary | ICD-10-CM | POA: Diagnosis not present

## 2022-12-29 DIAGNOSIS — N3941 Urge incontinence: Secondary | ICD-10-CM | POA: Diagnosis not present

## 2022-12-31 DIAGNOSIS — R0600 Dyspnea, unspecified: Secondary | ICD-10-CM | POA: Diagnosis not present

## 2022-12-31 DIAGNOSIS — R059 Cough, unspecified: Secondary | ICD-10-CM | POA: Diagnosis not present

## 2022-12-31 DIAGNOSIS — Z79899 Other long term (current) drug therapy: Secondary | ICD-10-CM | POA: Diagnosis not present

## 2022-12-31 DIAGNOSIS — R058 Other specified cough: Secondary | ICD-10-CM | POA: Diagnosis not present

## 2022-12-31 DIAGNOSIS — I517 Cardiomegaly: Secondary | ICD-10-CM | POA: Diagnosis not present

## 2022-12-31 DIAGNOSIS — I700 Atherosclerosis of aorta: Secondary | ICD-10-CM | POA: Diagnosis not present

## 2022-12-31 DIAGNOSIS — J432 Centrilobular emphysema: Secondary | ICD-10-CM | POA: Diagnosis not present

## 2022-12-31 DIAGNOSIS — R0981 Nasal congestion: Secondary | ICD-10-CM | POA: Diagnosis not present

## 2022-12-31 DIAGNOSIS — R0602 Shortness of breath: Secondary | ICD-10-CM | POA: Diagnosis not present

## 2022-12-31 DIAGNOSIS — J3489 Other specified disorders of nose and nasal sinuses: Secondary | ICD-10-CM | POA: Diagnosis not present

## 2023-01-05 DIAGNOSIS — J329 Chronic sinusitis, unspecified: Secondary | ICD-10-CM | POA: Diagnosis not present

## 2023-01-09 DIAGNOSIS — J320 Chronic maxillary sinusitis: Secondary | ICD-10-CM | POA: Diagnosis not present

## 2023-01-09 DIAGNOSIS — B379 Candidiasis, unspecified: Secondary | ICD-10-CM | POA: Diagnosis not present

## 2023-01-09 DIAGNOSIS — J310 Chronic rhinitis: Secondary | ICD-10-CM | POA: Diagnosis not present

## 2023-01-09 DIAGNOSIS — R053 Chronic cough: Secondary | ICD-10-CM | POA: Diagnosis not present

## 2023-01-09 DIAGNOSIS — J329 Chronic sinusitis, unspecified: Secondary | ICD-10-CM | POA: Diagnosis not present

## 2023-01-31 ENCOUNTER — Ambulatory Visit: Payer: Medicare Other | Admitting: Sports Medicine

## 2023-02-01 DIAGNOSIS — R151 Fecal smearing: Secondary | ICD-10-CM | POA: Diagnosis not present

## 2023-02-01 DIAGNOSIS — R350 Frequency of micturition: Secondary | ICD-10-CM | POA: Diagnosis not present

## 2023-02-01 DIAGNOSIS — R3915 Urgency of urination: Secondary | ICD-10-CM | POA: Diagnosis not present

## 2023-02-01 DIAGNOSIS — M6289 Other specified disorders of muscle: Secondary | ICD-10-CM | POA: Diagnosis not present

## 2023-02-02 DIAGNOSIS — H524 Presbyopia: Secondary | ICD-10-CM | POA: Diagnosis not present

## 2023-02-04 DIAGNOSIS — Z6820 Body mass index (BMI) 20.0-20.9, adult: Secondary | ICD-10-CM | POA: Diagnosis not present

## 2023-02-04 DIAGNOSIS — Z23 Encounter for immunization: Secondary | ICD-10-CM | POA: Diagnosis not present

## 2023-02-04 DIAGNOSIS — R0981 Nasal congestion: Secondary | ICD-10-CM | POA: Diagnosis not present

## 2023-02-12 ENCOUNTER — Other Ambulatory Visit: Payer: Self-pay

## 2023-02-12 ENCOUNTER — Ambulatory Visit
Admission: RE | Admit: 2023-02-12 | Discharge: 2023-02-12 | Disposition: A | Discharge status: Home / Self Care | Payer: Medicare Other | Source: Ambulatory Visit | Attending: Emergency Medicine | Admitting: Emergency Medicine

## 2023-02-12 VITALS — BP 154/90 | HR 80 | Temp 98.3°F | Resp 18

## 2023-02-12 DIAGNOSIS — J329 Chronic sinusitis, unspecified: Secondary | ICD-10-CM

## 2023-02-12 MED ORDER — MOMETASONE FUROATE 50 MCG/ACT NA SUSP: 2.0000 | Freq: Every day | NASAL | 2 refills | Status: DC

## 2023-02-12 NOTE — ED Provider Notes (Signed)
[facility]    CSN: [id number] Arrival date & time: 02/12/23  1449    HISTORY   Chief Complaint  Patient presents with   Nasal Congestion    Entered by patient   HPI Glenda Reese is a pleasant, 82 y.o. female who presents to urgent care today. Patient reports a history of asthma and chronic sinusitis, states she has a prescription for a steroid and an antibiotic on hand given to her by her ENT, states she is here today to see if she needs to take it.  Patient denies sinus pain or pressure, headache, shortness of breath, cough, fever, purulent drainage from her nose.  Patient endorses congestion in her right ear.  Vital signs are normal arrival today.  The history is provided by the patient.   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    Home Medications    Prior to Admission medications   Medication Sig Start Date End Date Taking? Authorizing Provider  mometasone (NASONEX) 50 MCG/ACT nasal spray Place 2 sprays into the nose daily. 02/12/23 05/13/23 Yes  Theadora Rama Scales, PA-C  albuterol (PROVENTIL HFA;VENTOLIN HFA) 108 (90 BASE) MCG/ACT inhaler Inhale 1 puff into the lungs as needed.  02/27/14   [provider]  Ascorbic Acid (VITAMIN C) 1000 MG tablet Take 500 mg by mouth.     [provider]  Black Elderberry 50 MG/5ML SYRP Take by mouth.    [provider]  Calcium Carb-Cholecalciferol 1000-800 MG-UNIT TABS Take 1 tablet by mouth.    [provider]  DHEA 25 MG CAPS Take 1 tablet by mouth.    [provider]  loratadine (CLARITIN) 10 MG tablet Take by mouth.    [provider]  MAGNESIUM PO Take by mouth.    [provider]  Minoxidil 5 % FOAM Apply topically.    [provider]  Multiple Vitamin (MULTI-VITAMINS) TABS Take by mouth.    [provider]  nitroGLYCERIN (NITRODUR - DOSED IN MG/24 HR) 0.2 mg/hr patch Use 1/4 patch daily to the affected area. 12/27/18   Enid Baas, MD  Progesterone 25 MG SUPP Place 60 mg vaginally.     [provider]  SPIRIVA HANDIHALER 18 MCG inhalation capsule PLACE 1 CAPSULE INTO INHALER AND INHALE DAILY. 03/11/15   [provider]    Family History Family History  Problem Relation Age of Onset   Alzheimer's disease Mother    Cancer Father        colonin  43s   Tuberculosis Other        grandmother   Social History Social History   Tobacco Use   Smoking status: Never   Smokeless tobacco: Never  Substance Use Topics   Alcohol use: No    Alcohol/week: 0.0 standard drinks of alcohol   Drug use: No   Allergies   Tramadol; Amoxicillin-pot clavulanate; Anesthetics, halogenated; Ciprofloxacin; Codeine; Hydrocodone; Macrobid [nitrofurantoin monohyd macro]; and Sulfamethoxazole  Review of Systems Review of Systems Pertinent findings revealed after performing a 14 point review of systems has been noted in the history of present illness.  Physical Exam Vital Signs BP (!) 154/90 (BP Location: Right  Arm)   Pulse 80   Temp 98.3 F (36.8 C) (Oral)   Resp 18   SpO2 96%   No data found.  Physical Exam Vitals and nursing note reviewed.  Constitutional:      General: She is not in acute distress.    Appearance: Normal appearance. She is not ill-appearing.  HENT:     Head: Normocephalic and atraumatic.     Salivary Glands: Right salivary gland is not diffusely enlarged or tender. Left salivary gland is not diffusely enlarged or tender.     Right Ear: Tympanic membrane, ear canal and external ear normal. No drainage. No middle ear effusion. There is no impacted cerumen. Tympanic membrane is not erythematous or bulging.     Left Ear: Tympanic membrane, ear canal and external ear normal. No drainage.  No middle ear effusion. There is no impacted cerumen. Tympanic membrane is not erythematous or bulging.     Nose: Nose normal. No nasal deformity, septal deviation, mucosal edema, congestion or rhinorrhea.     Right Turbinates: Not enlarged, swollen or pale.     Left Turbinates: Not enlarged, swollen or pale.     Right Sinus: No maxillary sinus tenderness or frontal sinus tenderness.     Left Sinus: No maxillary sinus tenderness or frontal sinus tenderness.     Mouth/Throat:     Lips: Pink. No lesions.     Mouth: Mucous membranes are moist. No oral lesions.     Pharynx: Oropharynx is clear. Uvula midline. No posterior oropharyngeal erythema or uvula swelling.     Tonsils: No tonsillar exudate. 0 on the right. 0 on the left.  Eyes:     General: Lids are normal.        Right eye: No discharge.        Left eye: No discharge.     Extraocular Movements: Extraocular movements intact.     Conjunctiva/sclera: Conjunctivae normal.     Right eye: Right conjunctiva is not injected.     Left eye: Left conjunctiva is not injected.  Neck:     Trachea: Trachea and phonation normal.  Cardiovascular:     Rate and Rhythm: Normal rate and regular rhythm.     Pulses: Normal pulses.     Heart sounds:  Normal heart sounds. No murmur heard.    No friction rub. No gallop.  Pulmonary:     Effort: Pulmonary effort is normal. No accessory muscle usage, prolonged expiration or respiratory distress.     Breath sounds: Normal breath sounds. No stridor, decreased air movement or transmitted upper airway sounds. No decreased breath sounds, wheezing, rhonchi or rales.  Chest:     Chest wall: No tenderness.  Musculoskeletal:        General: Normal range of motion.     Cervical back: Normal range of motion  and neck supple. Normal range of motion.  Lymphadenopathy:     Cervical: No cervical adenopathy.  Skin:    General: Skin is warm and dry.     Findings: No erythema or rash.  Neurological:     General: No focal deficit present.     Mental Status: She is alert and oriented to person, place, and time.  Psychiatric:        Mood and Affect: Mood normal.        Behavior: Behavior normal.     Visual Acuity Right Eye Distance:   Left Eye Distance:   Bilateral Distance:    Right Eye Near:   Left Eye Near:    Bilateral Near:     UC Couse / Diagnostics / Procedures:     Radiology No results found.  Procedures Procedures (including critical care time) EKG  Pending results:  Labs Reviewed - No data to display  Medications Ordered in UC: Medications - No data to display  UC Diagnoses / Final Clinical Impressions(s)   I have reviewed the triage vital signs and the nursing notes.  Pertinent labs & imaging results that were available during my care of the patient were reviewed by me and considered in my medical decision making (see chart for details).    Final diagnoses:  Rhinosinusitis   Patient advised physical exam findings are unremarkable and not concerning for upper respiratory inflammation or bacterial infection.  Patient advised that if she is currently experiencing some mild congestion in her right ear, she could consider adding a nasal steroid spray to her current regimen of  Claritin, Atrovent and Singulair.  Conservative care recommended.  Return precautions advised.  Please see discharge instructions below for details of plan of care as provided to patient. ED Prescriptions     Medication Sig Dispense Auth. Provider   mometasone (NASONEX) 50 MCG/ACT nasal spray Place 2 sprays into the nose daily. 1 each Theadora Rama Scales, PA-C      PDMP not reviewed this encounter.  Pending results:  Labs Reviewed - No data to display    Discharge Instructions      Please read below to learn more about the medications, dosages and frequencies that I recommend to help alleviate your symptoms and to get you feeling better soon:   Claritin (loratadine): This is an excellent second-generation antihistamine that helps to reduce respiratory inflammatory response to environmental allergens.  This medication is not known to cause daytime sleepiness so it can be taken in the daytime.  If you find that it does make you sleepy, please feel free to take it at bedtime. Singulair (montelukast): This is a mast cell stabilizer that works well with antihistamines.  Mast cells are responsible for stimulating histamine production.  Reducing the activity of mast cells decreases the amount of histamines will be produced.  This reduces upper and lower respiratory inflammation caused by allergy exposure.  I recommend that you take this medication at the same time you take your antihistamine.   Nasonex (mometasone): This is a steroid nasal spray that used once daily, 1 spray in each nare.  This works best when used on a daily basis. This medication does not work well if it is only used when you think you need it.  After 3 to 5 days of use, you will notice significant reduction of the inflammation and mucus production that is currently being caused by exposure to allergens, whether seasonal or environmental.  The most common side effect of  this medication is nosebleeds.  If you experience a  nosebleed, please discontinue use for 1 week, then feel free to resume.   If you find that your insurance will not pay for this medication, please consider a different nasal steroids such as Flonase (fluticasone), or Nasacort (triamcinolone).    Atrovent (ipratropium): This is an excellent nasal decongestant spray that will not cause rebound congestion.  Please instill 2 sprays into each nare after using the nasal steroid and repeat 2 more times throughout the day.  I have added this nasal spray to the nasal steroid because nasal steroids can take several days to reach full effect.  Once you find that you are forgetting to use the spray more often than you remember to use it, you will know that you no longer need it.     If you find that your health insurance will not pay for allergy medications, please consider downloading the GoodRx app and using to get a better price than the "off the shelf" price.     Thank you for visiting urgent care today.  We appreciate the opportunity to participate in your care.     Disposition Upon Discharge:  Condition: stable for discharge home  Patient presented with an acute illness with associated systemic symptoms and significant discomfort requiring urgent management. In my opinion, this is a condition that a prudent lay person (someone who possesses an average knowledge of health and medicine) may potentially expect to result in complications if not addressed urgently such as respiratory distress, impairment of bodily function or dysfunction of bodily organs.   Routine symptom specific, illness specific and/or disease specific instructions were discussed with the patient and/or caregiver at length.   As such, the patient has been evaluated and assessed, work-up was performed and treatment was provided in alignment with urgent care protocols and evidence based medicine.  Patient/parent/caregiver has been advised that the patient may require follow up for further  testing and treatment if the symptoms continue in spite of treatment, as clinically indicated and appropriate.  Patient/parent/caregiver has been advised to return to the [facility] or PCP if no better; to PCP or the Emergency Department if new signs and symptoms develop, or if the current signs or symptoms continue to change or worsen for further workup, evaluation and treatment as clinically indicated and appropriate  The patient will follow up with their current PCP if and as advised. If the patient does not currently have a PCP we will assist them in obtaining one.   The patient may need specialty follow up if the symptoms continue, in spite of conservative treatment and management, for further workup, evaluation, consultation and treatment as clinically indicated and appropriate.  Patient/parent/caregiver verbalized understanding and agreement of plan as discussed.  All questions were addressed during visit.  Please see discharge instructions below for further details of plan.  This office note has been dictated using Teaching laboratory technician.  Unfortunately, this method of dictation can sometimes lead to typographical or grammatical errors.  I apologize for your inconvenience in advance if this occurs.  Please do not hesitate to reach out to me if clarification is needed.      Theadora Rama Scales, PA-C 02/12/23 1529

## 2023-02-12 NOTE — Discharge Instructions (Signed)
Please read below to learn more about the medications, dosages and frequencies that I recommend to help alleviate your symptoms and to get you feeling better soon:   Claritin (loratadine): This is an excellent second-generation antihistamine that helps to reduce respiratory inflammatory response to environmental allergens.  This medication is not known to cause daytime sleepiness so it can be taken in the daytime.  If you find that it does make you sleepy, please feel free to take it at bedtime. Singulair (montelukast): This is a mast cell stabilizer that works well with antihistamines.  Mast cells are responsible for stimulating histamine production.  Reducing the activity of mast cells decreases the amount of histamines will be produced.  This reduces upper and lower respiratory inflammation caused by allergy exposure.  I recommend that you take this medication at the same time you take your antihistamine.   Nasonex (mometasone): This is a steroid nasal spray that used once daily, 1 spray in each nare.  This works best when used on a daily basis. This medication does not work well if it is only used when you think you need it.  After 3 to 5 days of use, you will notice significant reduction of the inflammation and mucus production that is currently being caused by exposure to allergens, whether seasonal or environmental.  The most common side effect of this medication is nosebleeds.  If you experience a nosebleed, please discontinue use for 1 week, then feel free to resume.   If you find that your insurance will not pay for this medication, please consider a different nasal steroids such as Flonase (fluticasone), or Nasacort (triamcinolone).    Atrovent (ipratropium): This is an excellent nasal decongestant spray that will not cause rebound congestion.  Please instill 2 sprays into each nare after using the nasal steroid and repeat 2 more times throughout the day.  I have added this nasal spray to the nasal  steroid because nasal steroids can take several days to reach full effect.  Once you find that you are forgetting to use the spray more often than you remember to use it, you will know that you no longer need it.     If you find that your health insurance will not pay for allergy medications, please consider downloading the GoodRx app and using to get a better price than the "off the shelf" price.     Thank you for visiting urgent care today.  We appreciate the opportunity to participate in your care.

## 2023-02-12 NOTE — ED Triage Notes (Signed)
Pt c/o persistent cough, congestion and sinus infection  on and off since September, not getting better with prescribed abx and steroid.

## 2023-02-22 DIAGNOSIS — M25471 Effusion, right ankle: Secondary | ICD-10-CM | POA: Diagnosis not present

## 2023-02-22 DIAGNOSIS — M79671 Pain in right foot: Secondary | ICD-10-CM | POA: Diagnosis not present

## 2023-02-22 DIAGNOSIS — R600 Localized edema: Secondary | ICD-10-CM | POA: Diagnosis not present

## 2023-02-22 DIAGNOSIS — M19071 Primary osteoarthritis, right ankle and foot: Secondary | ICD-10-CM | POA: Diagnosis not present

## 2023-02-22 DIAGNOSIS — M25571 Pain in right ankle and joints of right foot: Secondary | ICD-10-CM | POA: Diagnosis not present

## 2023-02-22 DIAGNOSIS — M7989 Other specified soft tissue disorders: Secondary | ICD-10-CM | POA: Diagnosis not present

## 2023-02-22 DIAGNOSIS — M85871 Other specified disorders of bone density and structure, right ankle and foot: Secondary | ICD-10-CM | POA: Diagnosis not present

## 2023-03-01 DIAGNOSIS — Z09 Encounter for follow-up examination after completed treatment for conditions other than malignant neoplasm: Secondary | ICD-10-CM | POA: Diagnosis not present

## 2023-03-01 DIAGNOSIS — M79671 Pain in right foot: Secondary | ICD-10-CM | POA: Diagnosis not present

## 2023-03-01 DIAGNOSIS — Z1330 Encounter for screening examination for mental health and behavioral disorders, unspecified: Secondary | ICD-10-CM | POA: Diagnosis not present

## 2023-03-01 DIAGNOSIS — J329 Chronic sinusitis, unspecified: Secondary | ICD-10-CM | POA: Diagnosis not present

## 2023-03-08 DIAGNOSIS — R0982 Postnasal drip: Secondary | ICD-10-CM | POA: Diagnosis not present

## 2023-03-08 DIAGNOSIS — J322 Chronic ethmoidal sinusitis: Secondary | ICD-10-CM | POA: Diagnosis not present

## 2023-03-08 DIAGNOSIS — J320 Chronic maxillary sinusitis: Secondary | ICD-10-CM | POA: Diagnosis not present

## 2023-03-09 ENCOUNTER — Ambulatory Visit: Payer: Medicare Other | Admitting: Sports Medicine

## 2023-03-09 VITALS — BP 120/64 | Ht 62.0 in | Wt 113.0 lb

## 2023-03-09 DIAGNOSIS — G8929 Other chronic pain: Secondary | ICD-10-CM

## 2023-03-09 DIAGNOSIS — R200 Anesthesia of skin: Secondary | ICD-10-CM | POA: Diagnosis not present

## 2023-03-09 DIAGNOSIS — M25511 Pain in right shoulder: Secondary | ICD-10-CM | POA: Diagnosis not present

## 2023-03-09 DIAGNOSIS — M25552 Pain in left hip: Secondary | ICD-10-CM | POA: Diagnosis not present

## 2023-03-09 DIAGNOSIS — R202 Paresthesia of skin: Secondary | ICD-10-CM | POA: Diagnosis not present

## 2023-03-09 NOTE — Progress Notes (Signed)
PCP: Avel Sensor, MD  SUBJECTIVE:   HPI:  Patient is a 83 y.o. female here with chief complaint of bilateral foot numbness, right shoulder pain, and left hip pain.  She reports 6 months of numbness on the dorsal surface of bilateral feet. She denies any new injuries. She has history of peroneal neuropathy bilaterally. Does report that on new years she had swelling and pain in the right ankle and was evaluated in the ED. Denies any inciting injury at that time. Had x-rays which showed no ankle fracture and a negative DVT LE doppler. The swelling and pain have since resolved but has persistence of the numbness.  Reports lateral right shoulder pain over the past couple months. No inciting injury. Has history of rotator cuff tears in the past. This feels different. Pain is mild, but would like it checked out. Denies issues with ROM or weakness. No radiating pain or numbness/tingling down the arm.   Left hip has also been bothering her the past 2 months. No injury. Locates pain on lateral aspect of the hip. Pain is intermittent, but does bother her. No radiation of pain. Hasn't taken any medications for this. She is involved in [place].    Pertinent ROS were reviewed with the patient and found to be negative unless otherwise specified above in HPI.   PERTINENT  PMH / PSH / FH / SH:  Past Medical, Surgical, Social, and Family History Reviewed & Updated in the EMR.  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   Reaction unclear, was a while ago.     Codeine Itching   Hydrocodone Itching   Macrobid [Nitrofurantoin Monohyd Macro]    Sulfamethoxazole Itching    OBJECTIVE:  BP 120/64   Ht 5\' 2"  (1.575 m)   Wt 113 lb (51.3 kg)   BMI 20.67 kg/m   PHYSICAL EXAM:  GEN: Alert and Oriented, NAD, comfortable in exam room RESP: Unlabored respirations, symmetric chest rise PSY: normal mood, congruent affect   MSK EXAM: Right shoulder without deformity. No TTP. ROM is full. Mild weakness and discomfort with empty can testing, otherwise RTC strength is full and painless. Negative speeds and yergasons.  Left hip without deformity. Normal ROM. No pain with stinchfield. Negative FADIR, mild posterior discomfort with FABER. TTP over greater trochanter and glute med. Mild weakness with hip abduction compared to right, but overall fairly good strength for her age.  Ankle/Feet:  Pes Cavus arches bilaterally. Bilateral feet with lateral foot collapse and recruitment of toes 2-3, L>R.  Left foot with significant transverse arch collapse. Hammering of 2nd toe with knuckle pad. Overlap of toes 1-2 though less prominent d/t hammering of 2nd toe. Right foot with significant transverse arch collapse. Mild hammering of 2nd toe with significant overlay of 1-2.  Ankles with normal ROM bilaterally. Normal strength and posterior tib fxn. Mild TTP of right ATFL, though otherwise no significant ankle/tarsal/metatarsal TTP. Left foot non-tender. Normal dorsalis pedis pulses Diminished sensation on dorsum of bilateral feet.   Right Ankle  X-ray report from [facility] 02/22/23 reviewed: IMPRESSION:  1. No acute fracture.    2. No malalignment. 3. Nonspecific focus of mineralization adjacent to the medial aspect of the distal tibia. 4. Mild degenerative changes of the tibiotalar joint. 5. Osteopenia.   Reviewed right LE venous U/S report which was negative for DVT. Assessment & Plan Numbness and tingling of  foot Suspect this is progression of her peroneal neuropathy bilaterally, more prominently involving the superficial peroneal nerve given the collapse of her lateral foot column. 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  The amount of supination and forefoot varus shift causes significant curvature of both feet and increases stretch over superficial peroneal nerves.  Reassurance provided - recommended Vitamin B6 supplementation of 50mg  every day over the counter. We did provide her green inserts for improved cushion as well as placed small scaphoid pads for her pes cavus and small MT cookies for her transverse arch collapse. Chronic right shoulder pain Suspect this is a flare of tendinopathy in her right rotator cuff based on her exam. Reviewed prn tylenol/NSAIDs and home exercise program for rehabilitation. F/u in 6-8 weeks if failing to improve. 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 Reviewed prn tylenol/NSAIDs and home exercise program for rehabilitation focusing on hip abductor strengthening. F/u in 6-8 weeks if failing to improve.  Glean Salen, MD PGY-4, Sports Medicine Fellow [facility]  I observed and examined the patient with the [facility] resident and agree with assessment and plan.  Note reviewed and modified by me. Sterling Big, MD

## 2023-03-09 NOTE — Assessment & Plan Note (Signed)
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 Reviewed prn tylenol/NSAIDs and home exercise program for rehabilitation focusing on hip abductor strengthening. F/u in 6-8 weeks if failing to improve.

## 2023-03-09 NOTE — Assessment & Plan Note (Signed)
Suspect this is a flare of tendinopathy in her right rotator cuff based on her exam. Reviewed prn tylenol/NSAIDs and home exercise program for rehabilitation. F/u in 6-8 weeks if failing to improve.

## 2023-03-14 DIAGNOSIS — J320 Chronic maxillary sinusitis: Secondary | ICD-10-CM | POA: Diagnosis not present

## 2023-03-14 DIAGNOSIS — R002 Palpitations: Secondary | ICD-10-CM | POA: Diagnosis not present

## 2023-03-14 DIAGNOSIS — J4489 Other specified chronic obstructive pulmonary disease: Secondary | ICD-10-CM | POA: Diagnosis not present

## 2023-03-14 DIAGNOSIS — Z01818 Encounter for other preprocedural examination: Secondary | ICD-10-CM | POA: Diagnosis not present

## 2023-03-14 DIAGNOSIS — R000 Tachycardia, unspecified: Secondary | ICD-10-CM | POA: Diagnosis not present

## 2023-03-15 DIAGNOSIS — J320 Chronic maxillary sinusitis: Secondary | ICD-10-CM | POA: Diagnosis not present

## 2023-03-15 DIAGNOSIS — J322 Chronic ethmoidal sinusitis: Secondary | ICD-10-CM | POA: Diagnosis not present

## 2023-03-25 DIAGNOSIS — J329 Chronic sinusitis, unspecified: Secondary | ICD-10-CM | POA: Diagnosis not present

## 2023-03-28 DIAGNOSIS — R059 Cough, unspecified: Secondary | ICD-10-CM | POA: Diagnosis not present

## 2023-03-28 DIAGNOSIS — J329 Chronic sinusitis, unspecified: Secondary | ICD-10-CM | POA: Diagnosis not present

## 2023-03-28 DIAGNOSIS — R52 Pain, unspecified: Secondary | ICD-10-CM | POA: Diagnosis not present

## 2023-03-30 DIAGNOSIS — Z0000 Encounter for general adult medical examination without abnormal findings: Secondary | ICD-10-CM | POA: Diagnosis not present

## 2023-03-30 DIAGNOSIS — Z136 Encounter for screening for cardiovascular disorders: Secondary | ICD-10-CM | POA: Diagnosis not present

## 2023-04-04 DIAGNOSIS — Z0000 Encounter for general adult medical examination without abnormal findings: Secondary | ICD-10-CM | POA: Diagnosis not present

## 2023-04-19 DIAGNOSIS — R159 Full incontinence of feces: Secondary | ICD-10-CM | POA: Diagnosis not present

## 2023-04-19 DIAGNOSIS — N3941 Urge incontinence: Secondary | ICD-10-CM | POA: Diagnosis not present

## 2023-05-09 DIAGNOSIS — H353130 Nonexudative age-related macular degeneration, bilateral, stage unspecified: Secondary | ICD-10-CM | POA: Diagnosis not present

## 2023-05-09 DIAGNOSIS — M6289 Other specified disorders of muscle: Secondary | ICD-10-CM | POA: Diagnosis not present

## 2023-05-12 DIAGNOSIS — J329 Chronic sinusitis, unspecified: Secondary | ICD-10-CM | POA: Diagnosis not present

## 2023-05-12 DIAGNOSIS — J302 Other seasonal allergic rhinitis: Secondary | ICD-10-CM | POA: Diagnosis not present

## 2023-05-31 DIAGNOSIS — R9431 Abnormal electrocardiogram [ECG] [EKG]: Secondary | ICD-10-CM | POA: Diagnosis not present

## 2023-05-31 DIAGNOSIS — R000 Tachycardia, unspecified: Secondary | ICD-10-CM | POA: Diagnosis not present

## 2023-05-31 DIAGNOSIS — R002 Palpitations: Secondary | ICD-10-CM | POA: Diagnosis not present

## 2023-06-13 DIAGNOSIS — M6289 Other specified disorders of muscle: Secondary | ICD-10-CM | POA: Diagnosis not present

## 2023-06-21 DIAGNOSIS — L2089 Other atopic dermatitis: Secondary | ICD-10-CM | POA: Diagnosis not present

## 2023-06-21 DIAGNOSIS — L821 Other seborrheic keratosis: Secondary | ICD-10-CM | POA: Diagnosis not present

## 2023-06-23 DIAGNOSIS — J320 Chronic maxillary sinusitis: Secondary | ICD-10-CM | POA: Diagnosis not present

## 2023-06-23 DIAGNOSIS — J322 Chronic ethmoidal sinusitis: Secondary | ICD-10-CM | POA: Diagnosis not present

## 2023-06-23 DIAGNOSIS — J339 Nasal polyp, unspecified: Secondary | ICD-10-CM | POA: Diagnosis not present

## 2023-06-23 DIAGNOSIS — J383 Other diseases of vocal cords: Secondary | ICD-10-CM | POA: Diagnosis not present

## 2023-07-06 DIAGNOSIS — R0982 Postnasal drip: Secondary | ICD-10-CM | POA: Diagnosis not present

## 2023-07-06 DIAGNOSIS — J432 Centrilobular emphysema: Secondary | ICD-10-CM | POA: Diagnosis not present

## 2023-07-06 DIAGNOSIS — J4520 Mild intermittent asthma, uncomplicated: Secondary | ICD-10-CM | POA: Diagnosis not present

## 2023-07-06 DIAGNOSIS — J320 Chronic maxillary sinusitis: Secondary | ICD-10-CM | POA: Diagnosis not present

## 2023-07-14 DIAGNOSIS — H04123 Dry eye syndrome of bilateral lacrimal glands: Secondary | ICD-10-CM | POA: Diagnosis not present

## 2023-07-25 DIAGNOSIS — Z1231 Encounter for screening mammogram for malignant neoplasm of breast: Secondary | ICD-10-CM | POA: Diagnosis not present

## 2023-07-25 DIAGNOSIS — R92323 Mammographic fibroglandular density, bilateral breasts: Secondary | ICD-10-CM | POA: Diagnosis not present

## 2023-07-25 DIAGNOSIS — M6289 Other specified disorders of muscle: Secondary | ICD-10-CM | POA: Diagnosis not present

## 2023-08-03 DIAGNOSIS — M6289 Other specified disorders of muscle: Secondary | ICD-10-CM | POA: Diagnosis not present

## 2023-08-08 DIAGNOSIS — M6289 Other specified disorders of muscle: Secondary | ICD-10-CM | POA: Diagnosis not present

## 2023-08-16 DIAGNOSIS — N3941 Urge incontinence: Secondary | ICD-10-CM | POA: Diagnosis not present

## 2023-08-16 DIAGNOSIS — H00025 Hordeolum internum left lower eyelid: Secondary | ICD-10-CM | POA: Diagnosis not present

## 2023-08-16 DIAGNOSIS — R159 Full incontinence of feces: Secondary | ICD-10-CM | POA: Diagnosis not present

## 2023-09-04 DIAGNOSIS — K219 Gastro-esophageal reflux disease without esophagitis: Secondary | ICD-10-CM | POA: Diagnosis not present

## 2023-09-07 DIAGNOSIS — L814 Other melanin hyperpigmentation: Secondary | ICD-10-CM | POA: Diagnosis not present

## 2023-09-07 DIAGNOSIS — L821 Other seborrheic keratosis: Secondary | ICD-10-CM | POA: Diagnosis not present

## 2023-09-07 DIAGNOSIS — L570 Actinic keratosis: Secondary | ICD-10-CM | POA: Diagnosis not present

## 2023-09-07 DIAGNOSIS — L648 Other androgenic alopecia: Secondary | ICD-10-CM | POA: Diagnosis not present

## 2023-09-07 DIAGNOSIS — D225 Melanocytic nevi of trunk: Secondary | ICD-10-CM | POA: Diagnosis not present

## 2023-09-07 DIAGNOSIS — X32XXXA Exposure to sunlight, initial encounter: Secondary | ICD-10-CM | POA: Diagnosis not present

## 2023-09-26 DIAGNOSIS — J329 Chronic sinusitis, unspecified: Secondary | ICD-10-CM | POA: Diagnosis not present

## 2023-09-29 DIAGNOSIS — M7062 Trochanteric bursitis, left hip: Secondary | ICD-10-CM | POA: Diagnosis not present

## 2023-09-29 DIAGNOSIS — M2042 Other hammer toe(s) (acquired), left foot: Secondary | ICD-10-CM | POA: Diagnosis not present

## 2023-09-29 DIAGNOSIS — Z23 Encounter for immunization: Secondary | ICD-10-CM | POA: Diagnosis not present

## 2023-09-29 DIAGNOSIS — S81811A Laceration without foreign body, right lower leg, initial encounter: Secondary | ICD-10-CM | POA: Diagnosis not present

## 2023-09-30 DIAGNOSIS — S8991XA Unspecified injury of right lower leg, initial encounter: Secondary | ICD-10-CM | POA: Diagnosis not present

## 2023-09-30 DIAGNOSIS — S81811A Laceration without foreign body, right lower leg, initial encounter: Secondary | ICD-10-CM | POA: Diagnosis not present

## 2023-10-01 DIAGNOSIS — L03115 Cellulitis of right lower limb: Secondary | ICD-10-CM | POA: Diagnosis not present

## 2023-10-01 DIAGNOSIS — S81811A Laceration without foreign body, right lower leg, initial encounter: Secondary | ICD-10-CM | POA: Diagnosis not present

## 2023-10-01 DIAGNOSIS — M25471 Effusion, right ankle: Secondary | ICD-10-CM | POA: Diagnosis not present

## 2023-10-04 DIAGNOSIS — L03115 Cellulitis of right lower limb: Secondary | ICD-10-CM | POA: Diagnosis not present

## 2023-10-13 DIAGNOSIS — L03115 Cellulitis of right lower limb: Secondary | ICD-10-CM | POA: Diagnosis not present

## 2023-10-19 DIAGNOSIS — S80811D Abrasion, right lower leg, subsequent encounter: Secondary | ICD-10-CM | POA: Diagnosis not present

## 2023-10-19 DIAGNOSIS — Z79899 Other long term (current) drug therapy: Secondary | ICD-10-CM | POA: Diagnosis not present

## 2023-10-19 DIAGNOSIS — S81811A Laceration without foreign body, right lower leg, initial encounter: Secondary | ICD-10-CM | POA: Diagnosis not present

## 2023-10-19 DIAGNOSIS — W01198D Fall on same level from slipping, tripping and stumbling with subsequent striking against other object, subsequent encounter: Secondary | ICD-10-CM | POA: Diagnosis not present

## 2023-10-20 DIAGNOSIS — M205X2 Other deformities of toe(s) (acquired), left foot: Secondary | ICD-10-CM | POA: Diagnosis not present

## 2023-10-20 DIAGNOSIS — M24575 Contracture, left foot: Secondary | ICD-10-CM | POA: Diagnosis not present

## 2023-10-20 DIAGNOSIS — M79672 Pain in left foot: Secondary | ICD-10-CM | POA: Diagnosis not present

## 2023-10-20 DIAGNOSIS — M2042 Other hammer toe(s) (acquired), left foot: Secondary | ICD-10-CM | POA: Diagnosis not present

## 2023-10-30 DIAGNOSIS — A0472 Enterocolitis due to Clostridium difficile, not specified as recurrent: Secondary | ICD-10-CM | POA: Diagnosis not present

## 2023-10-30 DIAGNOSIS — R197 Diarrhea, unspecified: Secondary | ICD-10-CM | POA: Diagnosis not present

## 2023-10-30 DIAGNOSIS — S81801D Unspecified open wound, right lower leg, subsequent encounter: Secondary | ICD-10-CM | POA: Diagnosis not present

## 2023-10-30 DIAGNOSIS — N952 Postmenopausal atrophic vaginitis: Secondary | ICD-10-CM | POA: Diagnosis not present

## 2023-11-01 DIAGNOSIS — A0472 Enterocolitis due to Clostridium difficile, not specified as recurrent: Secondary | ICD-10-CM | POA: Diagnosis not present

## 2023-11-01 DIAGNOSIS — K219 Gastro-esophageal reflux disease without esophagitis: Secondary | ICD-10-CM | POA: Diagnosis not present

## 2023-11-03 DIAGNOSIS — L97912 Non-pressure chronic ulcer of unspecified part of right lower leg with fat layer exposed: Secondary | ICD-10-CM | POA: Diagnosis not present

## 2023-11-03 DIAGNOSIS — Y92009 Unspecified place in unspecified non-institutional (private) residence as the place of occurrence of the external cause: Secondary | ICD-10-CM | POA: Diagnosis not present

## 2023-11-03 DIAGNOSIS — W19XXXA Unspecified fall, initial encounter: Secondary | ICD-10-CM | POA: Diagnosis not present

## 2023-11-12 DIAGNOSIS — M79602 Pain in left arm: Secondary | ICD-10-CM | POA: Diagnosis not present

## 2023-11-12 DIAGNOSIS — M79601 Pain in right arm: Secondary | ICD-10-CM | POA: Diagnosis not present

## 2023-11-12 DIAGNOSIS — M25512 Pain in left shoulder: Secondary | ICD-10-CM | POA: Diagnosis not present

## 2023-11-12 DIAGNOSIS — M25532 Pain in left wrist: Secondary | ICD-10-CM | POA: Diagnosis not present

## 2023-11-12 DIAGNOSIS — W07XXXA Fall from chair, initial encounter: Secondary | ICD-10-CM | POA: Diagnosis not present

## 2023-11-12 DIAGNOSIS — M25522 Pain in left elbow: Secondary | ICD-10-CM | POA: Diagnosis not present

## 2023-11-13 DIAGNOSIS — S52502A Unspecified fracture of the lower end of left radius, initial encounter for closed fracture: Secondary | ICD-10-CM | POA: Diagnosis not present

## 2023-11-13 DIAGNOSIS — N952 Postmenopausal atrophic vaginitis: Secondary | ICD-10-CM | POA: Diagnosis not present

## 2023-11-13 DIAGNOSIS — R8271 Bacteriuria: Secondary | ICD-10-CM | POA: Diagnosis not present

## 2023-11-13 DIAGNOSIS — A0472 Enterocolitis due to Clostridium difficile, not specified as recurrent: Secondary | ICD-10-CM | POA: Diagnosis not present

## 2023-11-20 DIAGNOSIS — S52502A Unspecified fracture of the lower end of left radius, initial encounter for closed fracture: Secondary | ICD-10-CM | POA: Diagnosis not present

## 2023-11-21 DIAGNOSIS — W19XXXD Unspecified fall, subsequent encounter: Secondary | ICD-10-CM | POA: Diagnosis not present

## 2023-11-21 DIAGNOSIS — L97912 Non-pressure chronic ulcer of unspecified part of right lower leg with fat layer exposed: Secondary | ICD-10-CM | POA: Diagnosis not present

## 2023-11-21 DIAGNOSIS — Y92009 Unspecified place in unspecified non-institutional (private) residence as the place of occurrence of the external cause: Secondary | ICD-10-CM | POA: Diagnosis not present

## 2023-11-23 DIAGNOSIS — A0472 Enterocolitis due to Clostridium difficile, not specified as recurrent: Secondary | ICD-10-CM | POA: Diagnosis not present

## 2023-12-05 DIAGNOSIS — J320 Chronic maxillary sinusitis: Secondary | ICD-10-CM | POA: Diagnosis not present

## 2023-12-06 DIAGNOSIS — A0472 Enterocolitis due to Clostridium difficile, not specified as recurrent: Secondary | ICD-10-CM | POA: Diagnosis not present

## 2023-12-08 DIAGNOSIS — S52592D Other fractures of lower end of left radius, subsequent encounter for closed fracture with routine healing: Secondary | ICD-10-CM | POA: Diagnosis not present

## 2023-12-08 DIAGNOSIS — S52502A Unspecified fracture of the lower end of left radius, initial encounter for closed fracture: Secondary | ICD-10-CM | POA: Diagnosis not present

## 2023-12-11 DIAGNOSIS — S46012A Strain of muscle(s) and tendon(s) of the rotator cuff of left shoulder, initial encounter: Secondary | ICD-10-CM | POA: Diagnosis not present

## 2023-12-12 ENCOUNTER — Ambulatory Visit: Admitting: Family Medicine

## 2023-12-12 ENCOUNTER — Encounter: Payer: Self-pay | Admitting: Family Medicine

## 2023-12-12 ENCOUNTER — Other Ambulatory Visit: Payer: Self-pay

## 2023-12-12 VITALS — BP 116/72 | Ht 62.0 in | Wt 112.0 lb

## 2023-12-12 DIAGNOSIS — M19012 Primary osteoarthritis, left shoulder: Secondary | ICD-10-CM

## 2023-12-12 DIAGNOSIS — S46212A Strain of muscle, fascia and tendon of other parts of biceps, left arm, initial encounter: Secondary | ICD-10-CM

## 2023-12-12 DIAGNOSIS — S46812A Strain of other muscles, fascia and tendons at shoulder and upper arm level, left arm, initial encounter: Secondary | ICD-10-CM

## 2023-12-12 DIAGNOSIS — M25512 Pain in left shoulder: Secondary | ICD-10-CM

## 2023-12-12 MED ORDER — NITROGLYCERIN 0.2 MG/HR TD PT24: MEDICATED_PATCH | TRANSDERMAL | 1 refills | Status: AC

## 2023-12-12 NOTE — Progress Notes (Signed)
 DATE OF VISIT: 12/12/2023        Glenda Reese DOB: 14-Aug-1940 MRN: [id number]  CC:  left shoulder pain  History of present Illness: Glenda Reese is a 83 y.o. female who presents for evaluation of left shoulder pain RHD She sustained a fall at home 11/12/2023.  She was seated on an adjustable barstool and her breakfast table, cylinder collapsed and she fell to the floor on her left arm. Presented to the ER 11/12/2023 with complaint of left shoulder and arm pain - ER notes reviewed in detail during the visit today - Multiple x-rays completed of the left shoulder, left humerus, left elbow, left forearm, left wrist which revealed distal radius fracture, also noted to have mild AC and glenohumeral joint osteoarthritis, no other bony abnormalities - Was fitted with a wrist splint and instructed to follow-up with Ortho  She notes that initially did not have much shoulder pain, but has been experiencing more pain over the last week Denies any new injury or trauma Having trouble getting dressed Some pain with opening doors Pain with lifting and overhead activities Does have prior history of shoulder issues History of right rotator cuff tear many years ago Has been using occasional Tylenol and ibuprofen No neck pain, numbness/tingling, loss of sensation.    Medications:  Outpatient Encounter Medications as of 12/12/2023  Medication Sig   nitroGLYCERIN  (NITRODUR - DOSED IN MG/24 HR) 0.2 mg/hr patch Use 1/4 patch daily to the affected area.   albuterol (PROVENTIL HFA;VENTOLIN HFA) 108 (90 BASE) MCG/ACT inhaler Inhale 1 puff into the lungs as needed.    Ascorbic Acid (VITAMIN C) 1000 MG tablet Take 500 mg by mouth.    Black Elderberry 50 MG/5ML SYRP Take by mouth.   Calcium Carb-Cholecalciferol 1000-800 MG-UNIT TABS Take 1 tablet by mouth.   DHEA 25 MG CAPS Take 1 tablet by mouth.   loratadine (CLARITIN) 10 MG tablet Take by mouth.   MAGNESIUM PO Take by mouth.   Minoxidil 5 % FOAM Apply  topically.   mometasone  (NASONEX ) 50 MCG/ACT nasal spray Place 2 sprays into the nose daily.   Multiple Vitamin (MULTI-VITAMINS) TABS Take by mouth.   Progesterone 25 MG SUPP Place 60 mg vaginally.    SPIRIVA HANDIHALER 18 MCG inhalation capsule PLACE 1 CAPSULE INTO INHALER AND INHALE DAILY.   [DISCONTINUED] nitroGLYCERIN  (NITRODUR - DOSED IN MG/24 HR) 0.2 mg/hr patch Use 1/4 patch daily to the affected area.   No facility-administered encounter medications on file as of 12/12/2023.    Allergies: is allergic to tramadol; amoxicillin-pot clavulanate; anesthetics, halogenated; ciprofloxacin; codeine; hydrocodone; macrobid [nitrofurantoin monohyd macro]; and sulfamethoxazole.  Physical Examination: Vitals: BP 116/72   Ht 5' 2 (1.575 m)   Wt 112 lb (50.8 kg)   BMI 20.49 kg/m  GENERAL:  Glenda Reese is a 83 y.o. female appearing their stated age, alert and oriented x 3, in no apparent distress.  SKIN: no rashes or lesions, skin clean, dry, intact MSK: Neck: Good range of motion without pain.  No midline or paraspinal tenderness Shoulder: Left shoulder without any gross deformity.  Tender to palpation over the [facility] joint, the bicipital groove, greater tuberosity.  Decreased active range of motion limited by approximately 50% in all planes.  Near full passive range of motion in all planes with associated pain.  Positive empty can, negative Neer's, negative Hawkins, positive drop arm.  Rotator cuff strength 4 -/5 with abduction and external rotation, otherwise rotator cuff strength 5 -/5 throughout. Right shoulder with  full range of motion without pain, weakness, instability Normal grip strength bilaterally Left wrist in removable wrist brace NEURO: sensation intact to light touch upper extremity bilaterally VASC: pulses 2+ and symmetric radial bilaterally, no edema  Radiology: [facility] ultrasound left shoulder Date: 12/12/2023 Indication: Left shoulder pain status post fall  11/12/2023 Findings: - Biceps tendon with proximal disruption and suspected full-thickness tear.  Has some surrounding edema at the level of the bicipital groove.  Examined in long and short axis views - Normal-appearing pectoralis major insertion on the proximal humerus - Subscapularis with hypoechoic change and partial articular sided tearing at the insertional footplate seen in long and short axis views. - No dynamic internal impingement on the coracoid - AC joint with mild degenerative changes with positive geyser sign - Increased fluid in the subacromial/subdeltoid space - Full-thickness tear of the supraspinatus is noted in long and short axis views - Infraspinatus with near full-thickness tear - Normal-appearing teres minor -  posterior glenohumeral joint with mild degenerative changes.  No effusion  Impression: - Proximal biceps tendon tear of the long head of the biceps - Subscapularis tendinopathy with associated partial-thickness tear - Full-thickness supraspinatus tear - Partial tear of the infraspinatus - Mild AC joint and glenohumeral osteoarthritis Images and interpretation completed by Rainell Cedar, DO    X-ray of left shoulder 11/12/2023 in the ER showing: IMPRESSION:  1. No acute fracture.  2.  No malalignment.  3.  Mild acromioclavicular and glenohumeral joint degenerative changes.   X-ray of the left humerus 11/12/2023 in the ER showing: IMPRESSION:  No acute fracture or malalignment.  Assessment & Plan  1. Traumatic tear of supraspinatus tendon, left, initial encounter 2. Rupture of left proximal biceps tendon, initial encounter 3. Partial tear of left subscapularis tendon, initial encounter 4. Tear of left infraspinatus tendon, initial encounter 5. Arthritis of left acromioclavicular joint Acute left shoulder pain status post fall 11/12/2023 MSK ultrasound today showing proximal biceps tendon tear, full-thickness tear of the supraspinatus, and partial tears of  the subscapularis and infraspinatus  Plan: - ER notes and imaging reviewed as noted above - MSK ultrasound completed with findings as noted above - Diagnosis and treatment discussed.  Patient would benefit from physical therapy, referral was given - Patient has done well with prior topical nitroglycerin  therapy for other tendon injuries.  She has not had any side effects.  Will start nitroglycerin  oh apply quarter patch to the left shoulder, change daily.  Anticipate up to 12 weeks of treatment.  Reviewed potential side effects of rash and headache.  She will reach out if she has any issues - Recommended Voltaren gel every 6-8 hours as needed.  She will try to limit taking with oral NSAIDs - Can continue to use Tylenol as needed - Can use heat or ice as needed - Will follow-up with me in 4 weeks for reevaluation, sooner as needed   Patient expressed understanding & agreement with above.  Encounter Diagnoses  Name Primary?   Traumatic tear of supraspinatus tendon, left, initial encounter Yes   Rupture of left proximal biceps tendon, initial encounter    Partial tear of left subscapularis tendon, initial encounter    Tear of left infraspinatus tendon, initial encounter    Arthritis of left acromioclavicular joint     Orders Placed This Encounter  Procedures   US  COMPLETE JOINT SPACE STRUCTURE UP LEFT

## 2023-12-12 NOTE — Patient Instructions (Signed)

## 2023-12-14 DIAGNOSIS — S52512D Displaced fracture of left radial styloid process, subsequent encounter for closed fracture with routine healing: Secondary | ICD-10-CM | POA: Diagnosis not present

## 2023-12-14 DIAGNOSIS — S52502D Unspecified fracture of the lower end of left radius, subsequent encounter for closed fracture with routine healing: Secondary | ICD-10-CM | POA: Diagnosis not present

## 2023-12-21 DIAGNOSIS — L648 Other androgenic alopecia: Secondary | ICD-10-CM | POA: Diagnosis not present

## 2023-12-22 DIAGNOSIS — A0472 Enterocolitis due to Clostridium difficile, not specified as recurrent: Secondary | ICD-10-CM | POA: Diagnosis not present

## 2023-12-27 DIAGNOSIS — S52592D Other fractures of lower end of left radius, subsequent encounter for closed fracture with routine healing: Secondary | ICD-10-CM | POA: Diagnosis not present

## 2023-12-27 DIAGNOSIS — S52502D Unspecified fracture of the lower end of left radius, subsequent encounter for closed fracture with routine healing: Secondary | ICD-10-CM | POA: Diagnosis not present

## 2023-12-29 ENCOUNTER — Telehealth: Payer: Self-pay

## 2023-12-29 NOTE — Telephone Encounter (Signed)
 Discussed option for PT. Pt states she would like to trial her tai chi exercises like she did before to see how that goes. If things are no better when she f/u with Dr. Harvey in a few weeks she will consider PT at that time.

## 2024-01-03 DIAGNOSIS — J322 Chronic ethmoidal sinusitis: Secondary | ICD-10-CM | POA: Diagnosis not present

## 2024-01-03 DIAGNOSIS — J320 Chronic maxillary sinusitis: Secondary | ICD-10-CM | POA: Diagnosis not present

## 2024-01-03 DIAGNOSIS — J310 Chronic rhinitis: Secondary | ICD-10-CM | POA: Diagnosis not present

## 2024-01-03 DIAGNOSIS — J339 Nasal polyp, unspecified: Secondary | ICD-10-CM | POA: Diagnosis not present

## 2024-01-09 ENCOUNTER — Ambulatory Visit: Admitting: Sports Medicine

## 2024-01-09 VITALS — BP 144/74 | Ht 62.0 in | Wt 110.0 lb

## 2024-01-09 DIAGNOSIS — G8929 Other chronic pain: Secondary | ICD-10-CM

## 2024-01-09 DIAGNOSIS — M25512 Pain in left shoulder: Secondary | ICD-10-CM | POA: Diagnosis not present

## 2024-01-09 DIAGNOSIS — S46812D Strain of other muscles, fascia and tendons at shoulder and upper arm level, left arm, subsequent encounter: Secondary | ICD-10-CM

## 2024-01-09 DIAGNOSIS — S46812A Strain of other muscles, fascia and tendons at shoulder and upper arm level, left arm, initial encounter: Secondary | ICD-10-CM | POA: Diagnosis not present

## 2024-01-09 DIAGNOSIS — S46212D Strain of muscle, fascia and tendon of other parts of biceps, left arm, subsequent encounter: Secondary | ICD-10-CM

## 2024-01-09 DIAGNOSIS — M19012 Primary osteoarthritis, left shoulder: Secondary | ICD-10-CM

## 2024-01-09 NOTE — Progress Notes (Addendum)
 [facility] A Department of [facility]   PCP: Mena Sherlean NOVAK, MD  CHIEF COMPLAINT: left shoulder pain s/p fall   HPI: Patient is a pleasant 83 y.o. female who presents today for follow up regarding left shoulder injury after traumatic fall.  She sustained a fall at home 11/12/2023. Multiple x-rays completed of the left shoulder, left humerus, left elbow, left forearm, left wrist which revealed distal radius fracture, also noted to have mild AC and glenohumeral joint osteoarthritis, no other bony abnormalities. Our US  on last visit (12/12/23) showed Proximal biceps tendon tear of the long head of the biceps. Subscapularis tendinopathy with associated partial-thickness tear. Full-thickness supraspinatus tear. Partial tear of the infraspinatus. Mild AC joint and glenohumeral osteoarthritis Treated with nitroglycerin  patches, voltaren, tylenol, ice/heat, and referral to PT. Since last visit pt states that she is making dramatic improvements with her left shoulder pain.  She endorses significant increased range of motion and is now able to perform overhead activities.  Gives example of not being able to buckle her seatbelt using her left arm before but now she has no difficulties.  Some of the home exercises provided did initially cause pain so she is primarily been using tai chi to rehab her shoulder.  Still been using the nitro patches daily.  No concerning side effects.  Very pleased with the progress of rehab so far.  PMH:  Past Medical History:  Diagnosis Date   ANA positive May 2016   1:320   Asthma    Skin cancer, basal cell 1999    Patient Active Problem List   Diagnosis Date Noted   Traumatic injury of right subscapularis tendon 11/02/2021   Nontraumatic complete tear of left rotator cuff 12/27/2018   Chronic foot pain, left 05/03/2018   Abnormality of gait 05/03/2018   Osteopenia 11/02/2017   Closed left fibular fracture  06/26/2017   Chronic right shoulder pain 07/03/2016   Left hip pain 11/18/2015   Low back pain with radiation 10/27/2015   Right hip pain 10/13/2015   Right knee pain 09/30/2015   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    ASCORBIC ACID (VITAMIN C) 1000 MG TABLET    Take 500 mg by mouth.    CALCIUM CARB-CHOLECALCIFEROL 1000-800 MG-UNIT TABS    Take 1 tablet by mouth.   MAGNESIUM PO    Take by mouth.   MINOXIDIL 5 % FOAM    Apply topically.   MULTIPLE VITAMIN (MULTI-VITAMINS) TABS    Take by mouth.   NITROGLYCERIN  (NITRODUR - DOSED IN MG/24 HR) 0.2 MG/HR PATCH    Use 1/4 patch daily to the affected area.   SPIRIVA HANDIHALER 18 MCG INHALATION CAPSULE    PLACE 1 CAPSULE INTO INHALER AND INHALE DAILY.    Social:  Social History   Tobacco Use   Smoking status: Never   Smokeless tobacco: Never  Substance Use Topics   Alcohol use:  No    Alcohol/week: 0.0 standard drinks of alcohol    REVIEW OF SYSTEMS:  ROS negative except as noted in HPI above   Objective Exam:  Vitals:   01/09/24 1145  BP: (!) 144/74  Weight: 110 lb (49.9 kg)  Height: 5' 2 (1.575 m)    GENERAL: Patient is afebrile, Vital signs reviewed, well appearing, Patient appears comfortable, Alert and lucid. No apparent distress.   Physical Exam   Ortho Exam:  RESULTS:  Labs: No results found for this  or any previous visit (from the past 48 hours).  Imaging:  No orders to display    Assessment/Plan:  1. Chronic left shoulder pain   2. Traumatic tear of supraspinatus tendon, left, initial encounter   3. Rupture of left proximal biceps tendon, subsequent encounter   4. Partial tear of left subscapularis tendon, subsequent encounter   5. Tear of left infraspinatus tendon, subsequent encounter   6. Arthritis of left acromioclavicular joint    -Continue nitro patchesusing 1/4 per day around rotator cuff for additional 6 weeks given dramatic improvement of symptoms so far - Continue tai chi for shoulder rehab but also begin to incorporate some of the home exercises provided at last visit.  If they cause flare and pain do last until you are able to find a balance of proper rehab without aggravating injury - Continue Voltaren, Tylenol for pain management - follow up in 2-3 months for re-evaluation   New Prescriptions   No medications on file    Medications, medical history, allergies, surgical history, hospitalizations, family history, social history, ROS and vitals entered by nursing staff and reviewed by myself.  I discussed with the patient the diagnosis, treatment plan, indications for return to the emergency department, and for expected follow-up. The patient verbalized an understanding. The patient is asked if there are any questions or concerns. We discuss the case, until all issues are addressed to the patient's satisfaction.  Follow up per instructions including returning for additional office visit if symptoms worsen or proceeding to the emergency department or urgent care in the next 12-24hrs if there is an acute concerning increasing symptoms, pain, fevers, or other symptoms.  Prentice Agent, DO  12:20 PM, 01/09/2024  01/25/24 Now with RT sided shoulder pain with history of prior RC injury.  OK to try some NTG- 1/4 - as this has worked before.  Use Tai Chi activity for  rehab.  PATRICE Haddock, MD

## 2024-01-27 DIAGNOSIS — L239 Allergic contact dermatitis, unspecified cause: Secondary | ICD-10-CM | POA: Diagnosis not present

## 2024-01-30 DIAGNOSIS — H524 Presbyopia: Secondary | ICD-10-CM | POA: Diagnosis not present

## 2024-03-19 ENCOUNTER — Ambulatory Visit: Admitting: Sports Medicine

## 2024-03-19 ENCOUNTER — Ambulatory Visit: Admitting: Family Medicine
